# Patient Record
Sex: Female | Born: 1990 | Race: White | Hispanic: No | Marital: Single | Smoking: Never smoker
Health system: Southern US, Community
[De-identification: ages and names within clinical notes are randomized; demographics above are authoritative.]

## PROBLEM LIST (undated history)

## (undated) DIAGNOSIS — Z973 Presence of spectacles and contact lenses: Secondary | ICD-10-CM

## (undated) DIAGNOSIS — M542 Cervicalgia: Secondary | ICD-10-CM

## (undated) DIAGNOSIS — M25519 Pain in unspecified shoulder: Secondary | ICD-10-CM

## (undated) DIAGNOSIS — Z8619 Personal history of other infectious and parasitic diseases: Secondary | ICD-10-CM

## (undated) DIAGNOSIS — R51 Headache: Secondary | ICD-10-CM

## (undated) DIAGNOSIS — R519 Headache, unspecified: Secondary | ICD-10-CM

## (undated) HISTORY — DX: Personal history of other infectious and parasitic diseases: Z86.19

## (undated) HISTORY — PX: NO PAST SURGERIES: SHX2092

## (undated) HISTORY — DX: Headache: R51

## (undated) HISTORY — DX: Headache, unspecified: R51.9

---
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
## 2012-01-18 LAB — HM PAP SMEAR: HM PAP: NORMAL

## 2013-08-05 ENCOUNTER — Encounter: Payer: Self-pay | Admitting: Adult Health

## 2013-08-05 ENCOUNTER — Ambulatory Visit (INDEPENDENT_AMBULATORY_CARE_PROVIDER_SITE_OTHER): Payer: 59 | Admitting: Adult Health

## 2013-08-05 VITALS — BP 106/70 | HR 71 | Temp 98.4°F | Resp 14 | Ht 65.5 in | Wt 240.0 lb

## 2013-08-05 DIAGNOSIS — B49 Unspecified mycosis: Secondary | ICD-10-CM | POA: Insufficient documentation

## 2013-08-05 DIAGNOSIS — E669 Obesity, unspecified: Secondary | ICD-10-CM

## 2013-08-05 DIAGNOSIS — Z0000 Encounter for general adult medical examination without abnormal findings: Secondary | ICD-10-CM

## 2013-08-05 DIAGNOSIS — E7800 Pure hypercholesterolemia, unspecified: Secondary | ICD-10-CM

## 2013-08-05 NOTE — Progress Notes (Signed)
Patient ID: Kimberly Newman, female   DOB: 03/31/1991, 23 y.o.   MRN: [id number]   Subjective:    Patient ID: Kimberly Newman, female    DOB: [birth year], 23 y.o.   MRN: [id number]  HPI  Pt is a 23 y/o female who presents to clinic to establish care. She was previously followed at [facility]. She is feeling well overall. She would like her ears checked. Her mother has recently been diagnosed and treated for ear fungus. They have been sharing earphones. She also reports problems with her tonsils at least twice a year. None at present.  Kasen brings last labs drawn at [employer] for employee health screenings. It shows borderline elevated HgbA1c, BMI 38.5, total cholesterol 253, HDL 48, LDL 187, triglycerides 95, waist circumference 45.25. She has been going to the gym approximately 4 times a week. She reports she could improve on her diet and is trying to do so.    Past Medical History  Diagnosis Date  . History of chickenpox   . Frequent headaches     Ibuprofen   Surgery Hx: Non contributory   Family History  Problem Relation Age of Onset  . Hyperlipidemia Mother   . Hypertension Mother   . Kidney disease Mother     Kidney stones  . Diabetes Mother   . Alcohol abuse Maternal Grandmother   . Hyperlipidemia Maternal Grandmother   . Hypertension Maternal Grandmother   . Diabetes Maternal Grandmother   . Leukemia Maternal Grandmother   . Hyperlipidemia Maternal Grandfather   . Hypertension Maternal Grandfather   . Diabetes Maternal Grandfather   . Parkinson's disease Maternal Grandfather      History   Social History  . Marital Status: Single    Spouse Name: N/A    Number of Children: N/A  . Years of Education: N/A   Occupational History  . Not on file.   Social History Main Topics  . Smoking status: Never Smoker   . Smokeless tobacco: Not on file  . Alcohol Use: No  . Drug Use: No  . Sexual Activity: Not on file   Other Topics Concern  . Not on file    Social History Narrative  . No narrative on file     Review of Systems  Constitutional: Negative.   HENT: Negative.   Eyes: Negative.   Respiratory: Negative.   Cardiovascular: Negative.   Gastrointestinal: Negative.   Endocrine: Negative.   Genitourinary: Negative.   Musculoskeletal: Negative.   Skin: Negative.   Allergic/Immunologic: Negative.   Neurological: Negative.   Hematological: Negative.   Psychiatric/Behavioral: Negative.        Objective:  There were no vitals taken for this visit.   Physical Exam  Constitutional: She is oriented to person, place, and time. No distress.  Overweight, pleasant 23 y/o  HENT:  Head: Normocephalic and atraumatic.  Nose: Nose normal.  Mouth/Throat: Oropharynx is clear and moist.  Bilateral ear fungus  Eyes: Conjunctivae and EOM are normal. Pupils are equal, round, and reactive to light.  Neck: Normal range of motion. Neck supple. No tracheal deviation present. No thyromegaly present.  Cardiovascular: Normal rate, regular rhythm, normal heart sounds and intact distal pulses.  Exam reveals no gallop and no friction rub.   No murmur heard. Pulmonary/Chest: Effort normal and breath sounds normal. No respiratory distress. She has no wheezes. She has no rales.  Abdominal: Soft.  Musculoskeletal: Normal range of motion. She exhibits no edema and no tenderness.  Lymphadenopathy:  She has no cervical adenopathy.  Neurological: She is alert and oriented to person, place, and time. She has normal reflexes. No cranial nerve deficit. Coordination normal.  Skin: Skin is warm and dry.  Psychiatric: She has a normal mood and affect. Her behavior is normal. Judgment and thought content normal.       Assessment & Plan:   1. Routine general medical examination at a health care facility Normal physical exam. Labs ordered cbc, cmet, lipids, tsh. She will have these done at [facility]. Up to date on PAP. Done last year and reports normal.  2.  Elevated cholesterol Check lipids. Encourage improvement in diet. Continue exercise  3. Obesity (BMI 30-39.9) Employer requires decrease of 10% of body weight for optimal insurance premium rate. She has been going to the gym but needs to improve in diet.  4. Fungus infection Mild fungal infection. Suspect from sharing earphone with her mother who was diagnosed and treated for same. Lotrimin cream bilateral ears at bedtime x 2 weeks.

## 2013-08-05 NOTE — Progress Notes (Signed)
Pre visit review using our clinic review tool, if applicable. No additional management support is needed unless otherwise documented below in the visit note. 

## 2013-08-15 ENCOUNTER — Telehealth: Payer: Self-pay | Admitting: *Deleted

## 2013-08-15 NOTE — Telephone Encounter (Signed)
Left vm requesting pt to return my call, need to give her lab results

## 2013-08-16 NOTE — Telephone Encounter (Signed)
Pt left vm.  Returning call.

## 2013-08-19 NOTE — Telephone Encounter (Signed)
Notified pt of lab results per R. Rey, "Elevated Cholesterol - Recommend increasing activity, aerobic exercise, low fat, low cholesterol diet, will recheck in 3-6 mths."

## 2013-08-25 ENCOUNTER — Encounter: Payer: Self-pay | Admitting: Adult Health

## 2013-09-17 ENCOUNTER — Encounter: Payer: Self-pay | Admitting: Adult Health

## 2013-09-30 ENCOUNTER — Encounter: Payer: Self-pay | Admitting: Internal Medicine

## 2013-09-30 ENCOUNTER — Ambulatory Visit (INDEPENDENT_AMBULATORY_CARE_PROVIDER_SITE_OTHER): Payer: 59 | Admitting: Internal Medicine

## 2013-09-30 VITALS — BP 110/72 | HR 79 | Temp 99.1°F | Resp 16 | Ht 65.5 in | Wt 239.0 lb

## 2013-09-30 DIAGNOSIS — S93409A Sprain of unspecified ligament of unspecified ankle, initial encounter: Secondary | ICD-10-CM

## 2013-09-30 DIAGNOSIS — M25579 Pain in unspecified ankle and joints of unspecified foot: Secondary | ICD-10-CM

## 2013-09-30 MED ORDER — MELOXICAM 15 MG PO TABS: 15.0000 mg | ORAL_TABLET | Freq: Every day | ORAL | Status: DC

## 2013-09-30 MED ORDER — HYDROCODONE-ACETAMINOPHEN 5-325 MG PO TABS: 1.0000 | ORAL_TABLET | Freq: Four times a day (QID) | ORAL | Status: DC | PRN

## 2013-09-30 NOTE — Progress Notes (Signed)
Pre-visit discussion using our clinic review tool. No additional management support is needed unless otherwise documented below in the visit note.  

## 2013-09-30 NOTE — Progress Notes (Signed)
Patient ID: Kimberly Newman, female   DOB: 03/19/1991, 23 y.o.   MRN: [id number]   Patient Active Problem List   Diagnosis Date Noted  . Sprain of ankle, unspecified site 09/30/2013  . Elevated cholesterol 08/05/2013  . Obesity (BMI 30-39.9) 08/05/2013  . Routine general medical examination at a health care facility 08/05/2013  . Fungus infection 08/05/2013    Subjective:  CC:   Chief Complaint  Patient presents with  . Acute Visit    Twisted ankle fell off rock while [place]. Abrasion on left hand.    HPI:   Kimberly Caprimber Rabanal is a 23 y.o. female who presents for Left lateral ankle pain since Saturday.  Patient had an uncontrolled slide  down a mossy rock while recreational hiking in [place] and twisted her ankle underneath her body .  She had pain , swelling and bruising of the skin over and under her lateral malleolus.  Has been applying ice and taking ibuprofen but has been walking on it.  [place] with inversion and eversion (more with inversion) and dorsiflexion.     Past Medical History  Diagnosis Date  . History of chickenpox   . Frequent headaches     Ibuprofen    No past surgical history on file.     The following portions of the patient's history were reviewed and updated as appropriate: Allergies, current medications, and problem list.    Review of Systems:   Patient denies headache, fevers, malaise, unintentional weight loss, skin rash, eye pain, sinus congestion and sinus pain, sore throat, dysphagia,  hemoptysis , cough, dyspnea, wheezing, chest pain, palpitations, orthopnea, edema, abdominal pain, nausea, melena, diarrhea, constipation, flank pain, dysuria, hematuria, urinary  Frequency, nocturia, numbness, tingling, seizures,  Focal weakness, Loss of consciousness,  Tremor, insomnia, depression, anxiety, and suicidal ideation.     History   Social History  . Marital Status: Single    Spouse Name: N/A    Number of Children: 0  . Years of  Education: 14   Occupational History  . 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 Smoking status: Never Smoker   . Smokeless tobacco: Not on file  . Alcohol Use: No  . Drug Use: No  . Sexual Activity: Not on file   Other Topics Concern  . Not on file   Social History Narrative   [place] She lives at home with her mother. 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 She has 2 dogs, 5 cats and 1 bird. She enjoys going to the movies, shopping and hiking. She also enjoys working out.      Exercise - 4 days a week   Diet - Trying to work on improvements.    Objective:  Filed Vitals:   09/30/13 1612  BP: 110/72  Pulse: 79  Temp: 99.1 F (37.3 C)  Resp: 16     General appearance: alert, cooperative and appears stated age  Back: symmetric, no curvature. ROM normal. No CVA tenderness. Lungs: clear to auscultation bilaterally Heart: regular rate and rhythm, S1, S2 normal, no murmur, click, rub or gallop Pulses: 2+ and symmetric Skin: Skin color, texture, turgor normal. No rashes or lesions Msk: left lateral malleolus swollen, tender to palpation, pain with passive inversion and dorsiflexion    Assessment and Plan:  Sprain of ankle, unspecified site Plain films to rule out ankle fracture..  Treat for liagment sprain with air cast. And nonweight bearing  for one week.  Crutches,  Vicodin, ice and NSAIDs. Return in one week for repeat evaluation    Updated Medication List Outpatient Encounter Prescriptions as of 09/30/2013  Medication Sig  . HYDROcodone-acetaminophen (NORCO/VICODIN) 5-325 MG per tablet Take 1 tablet by mouth every 6 (six) hours as needed for moderate pain.  . meloxicam (MOBIC) 15 MG tablet Take 1 tablet (15 mg total) by mouth daily.     Orders Placed This Encounter  Procedures  . DME Crutches  . DG Ankle Complete Left    Return in about 1 week (around 10/07/2013).

## 2013-09-30 NOTE — Patient Instructions (Signed)
Meloxicam 15 mg daily (anti inflammatory) vicodin every 6 hours as needed for pain  Use crutches   And Use the aircast  24/7 for the first week  Return in one week   Ankle Sprain An ankle sprain is an injury to the strong, fibrous tissues (ligaments) that hold the bones of your ankle joint together.  CAUSES An ankle sprain is usually caused by a fall or by twisting your ankle. Ankle sprains most commonly occur when you step on the outer edge of your foot, and your ankle turns inward. People who participate in sports are more prone to these types of injuries.  SYMPTOMS   Pain in your ankle. The pain may be present at rest or only when you are trying to stand or walk.  Swelling.  Bruising. Bruising may develop immediately or within 1 to 2 days after your injury.  Difficulty standing or walking, particularly when turning corners or changing directions. DIAGNOSIS  Your caregiver will ask you details about your injury and perform a physical exam of your ankle to determine if you have an ankle sprain. During the physical exam, your caregiver will press on and apply pressure to specific areas of your foot and ankle. Your caregiver will try to move your ankle in certain ways. An X-ray exam may be done to be sure a bone was not broken or a ligament did not separate from one of the bones in your ankle (avulsion fracture).  TREATMENT  Certain types of braces can help stabilize your ankle. Your caregiver can make a recommendation for this. Your caregiver may recommend the use of medicine for pain. If your sprain is severe, your caregiver may refer you to a surgeon who helps to restore function to parts of your skeletal system (orthopedist) or a physical therapist. HOME CARE INSTRUCTIONS   Apply ice to your injury for 1 2 days or as directed by your caregiver. Applying ice helps to reduce inflammation and pain.  Put ice in a plastic bag.  Place a towel between your skin and the bag.  Leave the ice  on for 15-20 minutes at a time, every 2 hours while you are awake.  Only take over-the-counter or prescription medicines for pain, discomfort, or fever as directed by your caregiver.  Elevate your injured ankle above the level of your heart as much as possible for 2 3 days.  If your caregiver recommends crutches, use them as instructed. Gradually put weight on the affected ankle. Continue to use crutches or a cane until you can walk without feeling pain in your ankle.  If you have a plaster splint, wear the splint as directed by your caregiver. Do not rest it on anything harder than a pillow for the first 24 hours. Do not put weight on it. Do not get it wet. You may take it off to take a shower or bath.  You may have been given an elastic bandage to wear around your ankle to provide support. If the elastic bandage is too tight (you have numbness or tingling in your foot or your foot becomes cold and blue), adjust the bandage to make it comfortable.  If you have an air splint, you may blow more air into it or let air out to make it more comfortable. You may take your splint off at night and before taking a shower or bath. Wiggle your toes in the splint several times per day to decrease swelling. SEEK MEDICAL CARE IF:   You have  rapidly increasing bruising or swelling.  Your toes feel extremely cold or you lose feeling in your foot.  Your pain is not relieved with medicine. SEEK IMMEDIATE MEDICAL CARE IF:  Your toes are numb or blue.  You have severe pain that is increasing. MAKE SURE YOU:   Understand these instructions.  Will watch your condition.  Will get help right away if you are not doing well or get worse. Document Released: 04/18/2005 Document Revised: 01/11/2012 Document Reviewed: 04/30/2011 [facility] Patient Information 2014 [place], [place].

## 2013-10-01 ENCOUNTER — Ambulatory Visit (INDEPENDENT_AMBULATORY_CARE_PROVIDER_SITE_OTHER)
Admission: RE | Admit: 2013-10-01 | Discharge: 2013-10-01 | Disposition: A | Discharge status: Home / Self Care | Payer: 59 | Source: Ambulatory Visit | Attending: Internal Medicine | Admitting: Internal Medicine

## 2013-10-01 ENCOUNTER — Encounter: Payer: Self-pay | Admitting: Internal Medicine

## 2013-10-01 DIAGNOSIS — M25579 Pain in unspecified ankle and joints of unspecified foot: Secondary | ICD-10-CM

## 2013-10-01 NOTE — Assessment & Plan Note (Signed)
Plain films to rule out ankle fracture..  Treat for liagment sprain with air cast. And nonweight bearing for one week.  Crutches,  Vicodin, ice and NSAIDs. Return in one week for repeat evaluation

## 2013-10-07 ENCOUNTER — Ambulatory Visit (INDEPENDENT_AMBULATORY_CARE_PROVIDER_SITE_OTHER): Payer: 59 | Admitting: Adult Health

## 2013-10-07 ENCOUNTER — Encounter: Payer: Self-pay | Admitting: Adult Health

## 2013-10-07 VITALS — BP 110/72 | HR 83 | Temp 97.6°F | Resp 12 | Ht 65.0 in | Wt 238.5 lb

## 2013-10-07 DIAGNOSIS — Z23 Encounter for immunization: Secondary | ICD-10-CM

## 2013-10-07 DIAGNOSIS — S93409A Sprain of unspecified ligament of unspecified ankle, initial encounter: Secondary | ICD-10-CM

## 2013-10-07 NOTE — Progress Notes (Signed)
Pre visit review using our clinic review tool, if applicable. No additional management support is needed unless otherwise documented below in the visit note. 

## 2013-10-07 NOTE — Progress Notes (Signed)
   Subjective:    Patient ID: Kimberly Newman, female    DOB: 1990/05/19, 23 y.o.   MRN: [id number]  HPI Pleasant 23 y/o female who presents for f/u left sprained ankle. She has a soft cast (splint) in place. She reports not using the crutches 2/2 inability to get around at work. She reports pain is improved. Has not been needing much pain medication. She has some swelling of the ankle. Has been icing and elevating whenever possible. Feels her symptoms are improving.  Pt needs her Tdap vaccine today.  Past Medical History  Diagnosis Date  . History of chickenpox   . Frequent headaches     Ibuprofen   Current Outpatient Prescriptions on File Prior to Visit  Medication Sig Dispense Refill  . HYDROcodone-acetaminophen (NORCO/VICODIN) 5-325 MG per tablet Take 1 tablet by mouth every 6 (six) hours as needed for moderate pain.  60 tablet  0  . meloxicam (MOBIC) 15 MG tablet Take 1 tablet (15 mg total) by mouth daily.  30 tablet  0   No current facility-administered medications on file prior to visit.    Review of Systems  Musculoskeletal: Positive for arthralgias and joint swelling.       Left ankle swelling.   Neurological: Negative for numbness.  All other systems reviewed and are negative.      Objective:   Physical Exam  Constitutional: She is oriented to person, place, and time. No distress.  Overweight, pleasant 23 y/o female  HENT:  Head: Normocephalic and atraumatic.  Eyes: Conjunctivae and EOM are normal.  Neck: Normal range of motion. Neck supple.  Cardiovascular: Normal rate and regular rhythm.   Pulmonary/Chest: Effort normal. No respiratory distress.  Musculoskeletal: Normal range of motion.  Neurological: She is alert and oriented to person, place, and time. She has normal reflexes.  Skin: Skin is warm and dry.  Psychiatric: She has a normal mood and affect. Her behavior is normal. Judgment and thought content normal.   BP 110/72  Pulse 83  Temp(Src) 97.6 F  (36.4 C) (Oral)  Resp 12  Ht 5\' 5"  (1.651 m)  Wt 238 lb 8 oz (108.183 kg)  BMI 39.69 kg/m2  SpO2 96%  LMP 09/11/2013     Assessment & Plan:   1. Need for prophylactic vaccination with combined diphtheria-tetanus-pertussis (DTP) vaccine Received in clinic today - Tdap vaccine greater than or equal to 7yo IM  2. Sprain of ankle, unspecified site Improving. Continues to elevate and ice. Not requiring too much pain medication. She will call if symptoms worsen. Continue to wear splint

## 2014-04-01 ENCOUNTER — Ambulatory Visit (INDEPENDENT_AMBULATORY_CARE_PROVIDER_SITE_OTHER): Payer: 59 | Admitting: Nurse Practitioner

## 2014-04-01 ENCOUNTER — Encounter: Payer: Self-pay | Admitting: Nurse Practitioner

## 2014-04-01 VITALS — BP 108/76 | HR 75 | Temp 98.1°F | Resp 14 | Ht 65.0 in | Wt 237.2 lb

## 2014-04-01 DIAGNOSIS — L03319 Cellulitis of trunk, unspecified: Secondary | ICD-10-CM

## 2014-04-01 DIAGNOSIS — L02219 Cutaneous abscess of trunk, unspecified: Secondary | ICD-10-CM

## 2014-04-01 NOTE — Progress Notes (Signed)
Subjective:    Patient ID: Kimberly Newman, female    DOB: 03/07/1991, 23 y.o.   MRN: [id number]  HPI  [name] is a 23 yo female here for a follow up of abdominal abcess/cellulitis after being seen at [facility] urgent care on last Friday 11/27. Accompanied by mother today who is a Psychologist, counsellingmicrobiologist at [employer].  This began on the Sunday previous (11/22)  noticed a small tender red spot on left abdomen. Worsened by that Wednesday 11/25 with fever, oozing from site. Went to [facility], it was incised and drained, packed, and doxycycline 100 mg BID (unknown period of time per pt). She states she returned on that Saturday for removal and re-packing. She had the last of the packing removed on Monday 11/30 and was told to FU with PCP. She states it has improved and she feels better.  Today: Still draining serous/purulent fluid, size is 2 cm in length and 0.5 cm open wound in height. No fever today. It is covered with large gauze and taped to abdomen. She is to leave it open to air. There is still bacitracin ointment in and around wound. The site is still erythematous and hard around the site. She is still taking Doxycycline without issue and stated that the urgent care personnel felt there were tracks and possibly more abscess left. They drained the largest pocket - per pt.   Review of Systems  Positive for erythema, open wound, pruritus, and purulent drainage.  Denies fevers, chills, sweats, increased drainage, increase in size of site.  Past Medical History  Diagnosis Date  . History of chickenpox   . Frequent headaches     Ibuprofen    History   Social History  . Marital Status: Single    Spouse Name: N/A    Number of Children: 0  . Years of Education: 14   Occupational History  . 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 Smoking status: Never Smoker   . Smokeless tobacco: Not on file  . Alcohol Use: No  . Drug Use: No  . Sexual Activity: Not on  file   Other Topics Concern  . 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 She lives at home with her mother. 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 She has 2 dogs, 5 cats and 1 bird. She enjoys going to the movies, shopping and hiking. She also enjoys working out.      Exercise - 4 days a week   Diet - Trying to work on improvements.    No past surgical history on file.  Family History  Problem Relation Age of Onset  . Hyperlipidemia Mother   . Hypertension Mother   . Kidney disease Mother     Kidney stones  . Diabetes Mother   . Alcohol abuse Maternal Grandmother   . Hyperlipidemia Maternal Grandmother   . Hypertension Maternal Grandmother   . Diabetes Maternal Grandmother   . Leukemia Maternal Grandmother   . Hyperlipidemia Maternal Grandfather   . Hypertension Maternal Grandfather   . Diabetes Maternal Grandfather   . Parkinson's disease Maternal Grandfather     No Known Allergies  No current outpatient prescriptions on file prior to visit.   No current facility-administered medications on file prior to visit.      Objective:   Physical Exam  Constitutional: She is oriented to person, place, and time.  Abdominal: Soft.  Bowel sounds are normal. There is tenderness in the left lower quadrant.    Neurological: She is alert and oriented to person, place, and time.  Skin: Laceration noted. No abrasion, no bruising, no burn, no ecchymosis, no lesion, no petechiae and no rash noted. She is not diaphoretic. There is erythema.     Psychiatric: She has a normal mood and affect. Her behavior is normal. Judgment and thought content normal.     BP 108/76 mmHg  Pulse 75  Temp(Src) 98.1 F (36.7 C) (Oral)  Resp 14  Ht 5\' 5"  (1.651 m)  Wt 237 lb 4 oz (107.616 kg)  BMI 39.48 kg/m2  SpO2 96%     Assessment & Plan:

## 2014-04-01 NOTE — Progress Notes (Signed)
Pre visit review using our clinic review tool, if applicable. No additional management support is needed unless otherwise documented below in the visit note. 

## 2014-04-01 NOTE — Patient Instructions (Signed)
Referral has been sent to a general surgeon for evaluation and treatment. Our office will call you with more information.  If there is a return of fever, increased itching, change in drainage please call our office.  It is okay to leave open to air. Use ointment sparingly.   Delayed Wound Closure Sometimes, your health care provider will decide to delay closing a wound for several days. This is done when the wound is badly bruised, dirty, or when it has been several hours since the injury happened. By delaying the closure of your wound, the risk of infection is reduced. Wounds that are closed in 3-7 days after being cleaned up and dressed heal just as well as those that are closed right away. HOME CARE INSTRUCTIONS  Rest and elevate the injured area until the pain and swelling are gone.  Have your wound checked as instructed by your health care provider. SEEK MEDICAL CARE IF:  You develop unusual or increased swelling or redness around the wound.  You have increasing pain or tenderness.  There is increasing fluid (drainage) or a bad smelling drainage coming from the wound. Document Released: 04/18/2005 Document Revised: 04/23/2013 Document Reviewed: 10/16/2012 [facility] Patient Information 2015 [place], [place]. This information is not intended to replace advice given to you by your health care provider. Make sure you discuss any questions you have with your health care provider.

## 2014-04-02 DIAGNOSIS — L03319 Cellulitis of trunk, unspecified: Principal | ICD-10-CM

## 2014-04-02 DIAGNOSIS — L02219 Cutaneous abscess of trunk, unspecified: Secondary | ICD-10-CM | POA: Insufficient documentation

## 2014-04-02 NOTE — Assessment & Plan Note (Addendum)
Unresolved- Seen at [facility] 3 x for I&D, packing and removal of packing. I still feel that it will not resolve if there is left over infection. Due to location I placed a urgent referral to general surgery for evaluation and further treatment. Size at visit 2 cm long by 0.5 cm height - did not check depth. Still draining. Discussed leaving open to air, cleaning area with saline (at drugstores), covering while showering, and drying thoroughly after shower. FU after surgical referral is complete. Requested culture information from [facility].

## 2014-04-07 ENCOUNTER — Ambulatory Visit (INDEPENDENT_AMBULATORY_CARE_PROVIDER_SITE_OTHER): Payer: 59 | Admitting: General Surgery

## 2014-04-07 ENCOUNTER — Encounter: Payer: Self-pay | Admitting: General Surgery

## 2014-04-07 VITALS — BP 118/78 | HR 78 | Resp 12 | Ht 65.0 in | Wt 242.0 lb

## 2014-04-07 DIAGNOSIS — IMO0002 Reserved for concepts with insufficient information to code with codable children: Secondary | ICD-10-CM

## 2014-04-07 DIAGNOSIS — K651 Peritoneal abscess: Secondary | ICD-10-CM

## 2014-04-07 NOTE — Patient Instructions (Signed)
Patient to return as needed. 

## 2014-04-07 NOTE — Progress Notes (Signed)
Patient ID: Kimberly Newman, female   DOB: 01/26/1991, 23 y.o.   MRN: [id number]  Chief Complaint  Patient presents with  . Other    abdomen abscess    HPI Kimberly Newman is a 23 y.o. female here today for a evaluation of an abdomen abscess. Patient noticed this about three weeks ago.  Patient states [facility]  [facility]  And they drained the area . Patient is currently taking doxycyline two times daily.  Patient states the area is still red and draining only a little.  No fever since the 03/28/14. The patient's mother who attended her today reports that non-MRSA staph was reported. HPI  Past Medical History  Diagnosis Date  . History of chickenpox   . Frequent headaches     Ibuprofen    No past surgical history on file.  Family History  Problem Relation Age of Onset  . Hyperlipidemia Mother   . Hypertension Mother   . Kidney disease Mother     Kidney stones  . Diabetes Mother   . Alcohol abuse Maternal Grandmother   . Hyperlipidemia Maternal Grandmother   . Hypertension Maternal Grandmother   . Diabetes Maternal Grandmother   . Leukemia Maternal Grandmother   . Hyperlipidemia Maternal Grandfather   . Hypertension Maternal Grandfather   . Diabetes Maternal Grandfather   . Parkinson's disease Maternal Grandfather     Social History History  Substance Use Topics  . Smoking status: Never Smoker   . Smokeless tobacco: Never Used  . Alcohol Use: 0.0 oz/week    0 Not specified per week    No Known Allergies  Current Outpatient Prescriptions  Medication Sig Dispense Refill  . doxycycline (VIBRAMYCIN) 100 MG capsule Take 100 mg by mouth 2 (two) times daily.   0   No current facility-administered medications for this visit.    Review of Systems Review of Systems  Constitutional: Negative.   Respiratory: Negative.   Cardiovascular: Negative.     Blood pressure 118/78, pulse 78, resp. rate 12, height 5\' 5"  (1.651 m), weight 242 lb (109.77 kg),  last menstrual period 03/19/2014.  Physical Exam Physical Exam  Abdominal: Soft. Normal appearance and bowel sounds are normal. There is no tenderness.    Thickening about a inch from the cut . No redness.     Data Reviewed None available.  Assessment    Superficial abdominal wall abscess, markedly improved status post incision and drainage and institution of doxycycline therapy.    Plan    It may take several weeks for the residual thickening to resolve. This will be accelerated if she makes use of local heat to the area. As long she shows continued improvement no additional intervention is required. Patient to return as needed.     PCP:  Margaretmary Eddyoss, Carrie   Mattison Stuckey W 04/08/2014, 11:53 AM

## 2014-04-08 DIAGNOSIS — IMO0002 Reserved for concepts with insufficient information to code with codable children: Secondary | ICD-10-CM | POA: Insufficient documentation

## 2014-05-12 ENCOUNTER — Encounter: Payer: Self-pay | Admitting: Nurse Practitioner

## 2014-05-12 ENCOUNTER — Ambulatory Visit (INDEPENDENT_AMBULATORY_CARE_PROVIDER_SITE_OTHER): Payer: Commercial Managed Care - PPO | Admitting: Nurse Practitioner

## 2014-05-12 VITALS — BP 118/72 | HR 87 | Temp 98.1°F | Resp 12 | Ht 65.0 in | Wt 244.0 lb

## 2014-05-12 DIAGNOSIS — L03319 Cellulitis of trunk, unspecified: Secondary | ICD-10-CM

## 2014-05-12 DIAGNOSIS — L02219 Cutaneous abscess of trunk, unspecified: Secondary | ICD-10-CM

## 2014-05-12 NOTE — Progress Notes (Signed)
Pre visit review using our clinic review tool, if applicable. No additional management support is needed unless otherwise documented below in the visit note. 

## 2014-05-12 NOTE — Patient Instructions (Signed)
Continue on Doxycycline. Please take a probiotic ( Align, Floraque or Culturelle) while you are on the antibiotic to prevent a serious antibiotic associated diarrhea  Called clostirudium dificile colitis and a vaginal yeast infection.   We will follow up next week. At that time we can do STD testing.

## 2014-05-12 NOTE — Progress Notes (Signed)
Subjective:    Patient ID: Kimberly Newman, female    DOB: [birth year], 24 y.o.   MRN: [id number]  HPI  Kimberly Newman is a 24 yo female with a CC of draining abscess in a new location on her left lower abdomen.   1) Pt reports [facility] and [facility]/8  Draining and provider checked it, no I&D recommended  Doxycycline helping 10 days on day 5, taking twice daily   Changes dressing 3 x day   Pt states it is improving.   2) Question about STD testing. Sexually active with 1 female partner. She wanted to know about what kind of tests are done and how they are sampled (blood, urine, swab ect...). She would like to return for STD testing.   Review of Systems  Constitutional: Negative for fever, chills, diaphoresis and fatigue.  Respiratory: Negative for chest tightness, shortness of breath and wheezing.   Cardiovascular: Negative for chest pain, palpitations and leg swelling.  Gastrointestinal: Negative for nausea, vomiting, diarrhea and rectal pain.  Skin: Positive for wound. Negative for rash.       New draining area on abdomen.   Neurological: Negative for dizziness, weakness, numbness and headaches.  Psychiatric/Behavioral: The patient is not nervous/anxious.    Past Medical History  Diagnosis Date  . History of chickenpox   . Frequent headaches     Ibuprofen    History   Social History  . Marital Status: Single    Spouse Name: N/A    Number of Children: 0  . Years of Education: 14   Occupational History  . Personnel officer [employer]   Social History Main Topics  . Smoking status: Never Smoker   . Smokeless tobacco: Never Used  . Alcohol Use: 0.0 oz/week    0 Not specified per week  . Drug Use: No  . Sexual Activity: Not on file   Other Topics Concern  . 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 She lives at home with her mother. 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 She has 2 dogs, 5 cats and 1  bird. She enjoys going to the movies, shopping and hiking. She also enjoys working out.      Exercise - 4 days a week   Diet - Trying to work on improvements.    No past surgical history on file.  Family History  Problem Relation Age of Onset  . Hyperlipidemia Mother   . Hypertension Mother   . Kidney disease Mother     Kidney stones  . Diabetes Mother   . Alcohol abuse Maternal Grandmother   . Hyperlipidemia Maternal Grandmother   . Hypertension Maternal Grandmother   . Diabetes Maternal Grandmother   . Leukemia Maternal Grandmother   . Hyperlipidemia Maternal Grandfather   . Hypertension Maternal Grandfather   . Diabetes Maternal Grandfather   . Parkinson's disease Maternal Grandfather     No Known Allergies  Current Outpatient Prescriptions on File Prior to Visit  Medication Sig Dispense Refill  . doxycycline (VIBRAMYCIN) 100 MG capsule Take 100 mg by mouth 2 (two) times daily.   0   No current facility-administered medications on file prior to visit.       Objective:   Physical Exam  Constitutional: She is oriented to person, place, and time. She appears well-developed and well-nourished. No distress.  Cardiovascular: Normal rate and regular rhythm.   Pulmonary/Chest:  Effort normal and breath sounds normal.  Abdominal: She exhibits no distension and no mass. There is tenderness. There is no rebound and no guarding.  LLQ- abscess  Neurological: She is alert and oriented to person, place, and time.  Skin: Skin is warm. She is not diaphoretic.     Psychiatric: She has a normal mood and affect. Her behavior is normal. Judgment and thought content normal.      Assessment & Plan:

## 2014-05-13 NOTE — Assessment & Plan Note (Signed)
New area. Improved old area. Continue Doxycyline until finished prescription. FU in 1 week. Warm compresses encouraged to continue draining. Add OTC probiotic to regimen.

## 2014-05-20 ENCOUNTER — Ambulatory Visit (INDEPENDENT_AMBULATORY_CARE_PROVIDER_SITE_OTHER): Payer: Commercial Managed Care - PPO | Admitting: Nurse Practitioner

## 2014-05-20 ENCOUNTER — Encounter: Payer: Self-pay | Admitting: Nurse Practitioner

## 2014-05-20 VITALS — BP 112/70 | HR 87 | Temp 97.9°F | Resp 12 | Ht 65.0 in | Wt 244.8 lb

## 2014-05-20 DIAGNOSIS — L02219 Cutaneous abscess of trunk, unspecified: Secondary | ICD-10-CM

## 2014-05-20 DIAGNOSIS — L03319 Cellulitis of trunk, unspecified: Secondary | ICD-10-CM

## 2014-05-20 DIAGNOSIS — Z113 Encounter for screening for infections with a predominantly sexual mode of transmission: Secondary | ICD-10-CM

## 2014-05-20 NOTE — Assessment & Plan Note (Signed)
Improving. Not draining currently, redness improved. 4 mm scab at center. Continue warm wet compresses

## 2014-05-20 NOTE — Patient Instructions (Signed)
Please visit the lab before leaving today.   Warm wet compresses on abdomen.

## 2014-05-20 NOTE — Assessment & Plan Note (Signed)
Stable. New partner. Not been previously tested. STD panel today and urine GC/Chlamydia.

## 2014-05-20 NOTE — Progress Notes (Signed)
Subjective:    Patient ID: Kimberly Newman, female    DOB: 09/19/1990, 24 y.o.   MRN: [id number]  HPI  Kimberly Newman is a 24 yo female with a CC follow up for abscess and wanting to be tested for STDs.   1) Finished antibiotics 2-3 days ago without complaints of diarrhea.  4 mm center, looks improved, redness around area is gone,   2) LMP- 05/10/14 - 05/15/14, normal   Sexually- female 7 months monogamous    No female partners before for current partner   Previous female partners for pt - 1.5 years prior   Review of Systems  Constitutional: Negative for fever, chills, diaphoresis and fatigue.  Respiratory: Negative for chest tightness, shortness of breath and wheezing.   Cardiovascular: Negative for chest pain, palpitations and leg swelling.  Gastrointestinal: Negative for nausea, vomiting, diarrhea and rectal pain.  Genitourinary: Negative for dysuria, vaginal bleeding, vaginal discharge, vaginal pain and dyspareunia.  Skin: Positive for color change. Negative for rash.       On abdomen from abscess healing, slightly red  Neurological: Negative for dizziness, weakness, numbness and headaches.  Psychiatric/Behavioral: The patient is not nervous/anxious.    Past Medical History  Diagnosis Date  . History of chickenpox   . Frequent headaches     Ibuprofen    History   Social History  . Marital Status: Single    Spouse Name: N/A    Number of Children: 0  . Years of Education: 14   Occupational History  . 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 Smoking status: Never Smoker   . Smokeless tobacco: Never Used  . Alcohol Use: 0.0 oz/week    0 Not specified per week  . Drug Use: No  . Sexual Activity: Not on file   Other Topics Concern  . 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 She lives at home with her mother. 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 She has 2 dogs, 5 cats and 1 bird. She enjoys going  to the movies, shopping and hiking. She also enjoys working out.      Exercise - 4 days a week   Diet - Trying to work on improvements.    No past surgical history on file.  Family History  Problem Relation Age of Onset  . Hyperlipidemia Mother   . Hypertension Mother   . Kidney disease Mother     Kidney stones  . Diabetes Mother   . Alcohol abuse Maternal Grandmother   . Hyperlipidemia Maternal Grandmother   . Hypertension Maternal Grandmother   . Diabetes Maternal Grandmother   . Leukemia Maternal Grandmother   . Hyperlipidemia Maternal Grandfather   . Hypertension Maternal Grandfather   . Diabetes Maternal Grandfather   . Parkinson's disease Maternal Grandfather     No Known Allergies  No current outpatient prescriptions on file prior to visit.   No current facility-administered medications on file prior to visit.       Objective:   Physical Exam  Constitutional: She is oriented to person, place, and time. She appears well-developed and well-nourished. No distress.  BP 112/70 mmHg  Pulse 87  Temp(Src) 97.9 F (36.6 C) (Oral)  Resp 12  Ht 5\' 5"  (1.651 m)  Wt 244 lb 12.8 oz (111.041 kg)  BMI 40.74 kg/m2  SpO2 96%   HENT:  Head: Normocephalic and atraumatic.  Neck: Normal  range of motion.  Cardiovascular: Normal rate and regular rhythm.   Pulmonary/Chest: Effort normal and breath sounds normal.  Abdominal: Soft. Bowel sounds are normal. She exhibits no distension and no mass. There is no tenderness. There is no rebound and no guarding.  Lymphadenopathy:    She has no cervical adenopathy.  Neurological: She is alert and oriented to person, place, and time.  Skin: Skin is warm and dry. No rash noted. She is not diaphoretic.  4 mm healing scab where drainage once was.   Psychiatric: She has a normal mood and affect. Her behavior is normal. Judgment and thought content normal.      Assessment & Plan:

## 2014-05-20 NOTE — Progress Notes (Signed)
Pre visit review using our clinic review tool, if applicable. No additional management support is needed unless otherwise documented below in the visit note. 

## 2014-05-21 LAB — STD PANEL
HEP B S AG: NEGATIVE
HIV 1&2 Ab, 4th Generation: NONREACTIVE

## 2014-05-21 LAB — GC/CHLAMYDIA PROBE AMP, URINE
CHLAMYDIA, SWAB/URINE, PCR: NEGATIVE
GC Probe Amp, Urine: NEGATIVE

## 2014-12-09 ENCOUNTER — Ambulatory Visit (INDEPENDENT_AMBULATORY_CARE_PROVIDER_SITE_OTHER): Payer: 59 | Admitting: Nurse Practitioner

## 2014-12-09 VITALS — BP 108/88 | HR 83 | Temp 98.8°F | Resp 14 | Ht 65.0 in | Wt 244.0 lb

## 2014-12-09 DIAGNOSIS — M25561 Pain in right knee: Secondary | ICD-10-CM | POA: Diagnosis not present

## 2014-12-09 DIAGNOSIS — R51 Headache: Secondary | ICD-10-CM | POA: Diagnosis not present

## 2014-12-09 DIAGNOSIS — R519 Headache, unspecified: Secondary | ICD-10-CM | POA: Insufficient documentation

## 2014-12-09 MED ORDER — FLUTICASONE PROPIONATE 50 MCG/ACT NA SUSP: 2.0000 | Freq: Every day | NASAL | Status: AC

## 2014-12-09 NOTE — Progress Notes (Signed)
Patient ID: Kimberly Newman, female    DOB: 02/02/1991  Age: 24 y.o. MRN: [id number]  CC: Labs Only   HPI Kimberly Newman presents for CC of frequent headaches and knee pain x 1 year, but worsening over 3 weeks.   1) Knee pain- Right knee bad for 1 year, denies trauma, anterior patella, feels like giving out, popping, cliicking, throbbing pain/dull/stabbing occasionally, fluctuating pain. Gym- elliptical made knee sore the next day. Squeaking sound with flex/ext Worst 8/10, 4/10 at best    Ibuprofen- slightly helpful   Hot wraps alt with ice- helpful   2) Headaches- improving over the last month, pt could not stand or do "anything" for 2 days, pressure, pain behind eyes L>R.   Sinus meds- not helpful   Gluten allergy- friend had headaches and   History Kimberly Newman has a past medical history of History of chickenpox and Frequent headaches.   She has no past surgical history on file.   Her family history includes Alcohol abuse in her maternal grandmother; Diabetes in her maternal grandfather, maternal grandmother, and mother; Hyperlipidemia in her maternal grandfather, maternal grandmother, and mother; Hypertension in her maternal grandfather, maternal grandmother, and mother; Kidney disease in her mother; Leukemia in her maternal grandmother; Parkinson's disease in her maternal grandfather.She reports that she has never smoked. She has never used smokeless tobacco. She reports that she drinks alcohol. She reports that she does not use illicit drugs.  No outpatient prescriptions prior to visit.   No facility-administered medications prior to visit.   ROS Review of Systems  Constitutional: Negative for fever, chills, diaphoresis and fatigue.  HENT: Positive for sinus pressure.   Eyes: Negative for visual disturbance.  Musculoskeletal: Positive for arthralgias. Negative for myalgias.       Right knee pain- patella  Skin: Negative for rash.  Neurological: Positive for headaches. Negative for  dizziness.    Objective:  BP 108/88 mmHg  Pulse 83  Temp(Src) 98.8 F (37.1 C)  Resp 14  Ht  (1.651 m)  Wt 244 lb (110.678 kg)  BMI 40.60 kg/m2  SpO2 97%  Physical Exam  Constitutional: She is oriented to person, place, and time. She appears well-developed and well-nourished. No distress.  HENT:  Head: Normocephalic and atraumatic.  Right Ear: External ear normal.  Left Ear: External ear normal.  Cardiovascular: Normal rate, regular rhythm and normal heart sounds.  Exam reveals no gallop and no friction rub.   No murmur heard. Pulmonary/Chest: Effort normal and breath sounds normal. No respiratory distress. She has no wheezes. She has no rales. She exhibits no tenderness.  Musculoskeletal: Normal range of motion. She exhibits tenderness. She exhibits no edema.  Patella tenderness only on right knee, crepitus present, no instability, strength 5/5 illiopsoas and quadricep. 2+Patellar reflex.   Neurological: She is alert and oriented to person, place, and time. No cranial nerve deficit. She exhibits normal muscle tone. Coordination normal.  Skin: Skin is warm and dry. No rash noted. She is not diaphoretic.  Psychiatric: She has a normal mood and affect. Her behavior is normal. Judgment and thought content normal.    Assessment & Plan:   Kimberly Newman was seen today for labs only.  Diagnoses and all orders for this visit:  Frequent headaches  Right knee pain -     DG Knee Complete 4 Views Right; Future  Other orders -     fluticasone (FLONASE) 50 MCG/ACT nasal spray; Place 2 sprays into both nostrils daily.  I am having  Kimberly Newman start on fluticasone.  Meds ordered this encounter  Medications  . fluticasone (FLONASE) 50 MCG/ACT nasal spray    Sig: Place 2 sprays into both nostrils daily.    Dispense:  16 g    Refill:  6    Order Specific Question:  Supervising Provider    Answer:  Sherlene Shams [[id number]]     Follow-up: Return if symptoms worsen or fail to  improve.

## 2014-12-09 NOTE — Patient Instructions (Addendum)
Rest, ice, compression, and elevation of the knee until x-ray comes back. Ibuprofen or aleve is also helpful.   Please have your celiac panel done and we will contact you with results.

## 2014-12-09 NOTE — Assessment & Plan Note (Addendum)
Will obtain Celiac panel from [facility]- form given to pt. Will follow. Will try flonase since it sounds more sinus pressure related.

## 2014-12-09 NOTE — Assessment & Plan Note (Signed)
Right patellar pain w/ crepitus, pt does feel like it is going to give out when standing intermittently. Will obtain right knee x-ray. RICE until results and NSAIDs.

## 2014-12-11 ENCOUNTER — Encounter: Payer: Self-pay | Admitting: Nurse Practitioner

## 2014-12-12 ENCOUNTER — Encounter: Payer: Self-pay | Admitting: Nurse Practitioner

## 2014-12-17 ENCOUNTER — Ambulatory Visit (INDEPENDENT_AMBULATORY_CARE_PROVIDER_SITE_OTHER)
Admission: RE | Admit: 2014-12-17 | Discharge: 2014-12-17 | Disposition: A | Discharge status: Home / Self Care | Payer: 59 | Source: Ambulatory Visit | Attending: Nurse Practitioner | Admitting: Nurse Practitioner

## 2014-12-17 DIAGNOSIS — M25561 Pain in right knee: Secondary | ICD-10-CM

## 2014-12-18 ENCOUNTER — Encounter: Payer: Self-pay | Admitting: Nurse Practitioner

## 2014-12-19 ENCOUNTER — Telehealth: Payer: Self-pay

## 2014-12-19 ENCOUNTER — Other Ambulatory Visit: Payer: Self-pay | Admitting: Nurse Practitioner

## 2014-12-19 DIAGNOSIS — M25561 Pain in right knee: Secondary | ICD-10-CM

## 2014-12-19 NOTE — Telephone Encounter (Signed)
-----   Message from Carrie M Doss, NP sent at 12/18/2014  4:47 PM EDT ----- Please let pt know that she is negative for celiac disease, but since there is not a test for just "gluten sensitivity" she would just try a gluten free diet.   Thanks,  Carrie 

## 2014-12-19 NOTE — Telephone Encounter (Signed)
LMTCB about lab results 

## 2014-12-22 ENCOUNTER — Telehealth: Payer: Self-pay

## 2014-12-22 NOTE — Telephone Encounter (Signed)
Informed pt of lab results, pt verbalized understanding 

## 2014-12-22 NOTE — Telephone Encounter (Signed)
-----   Message from Carollee Leitz, NP sent at 12/18/2014  4:47 PM EDT ----- Please let pt know that she is negative for celiac disease, but since there is not a test for just "gluten sensitivity" she would just try a gluten free diet.   Thanks,  [employer]

## 2014-12-26 ENCOUNTER — Ambulatory Visit: Payer: Self-pay | Admitting: Nurse Practitioner

## 2015-01-01 ENCOUNTER — Encounter: Payer: Self-pay | Admitting: Nurse Practitioner

## 2015-02-05 ENCOUNTER — Encounter: Payer: Self-pay | Admitting: Nurse Practitioner

## 2015-02-05 ENCOUNTER — Ambulatory Visit: Payer: Self-pay | Admitting: Nurse Practitioner

## 2015-02-05 ENCOUNTER — Ambulatory Visit (INDEPENDENT_AMBULATORY_CARE_PROVIDER_SITE_OTHER): Payer: 59 | Admitting: Nurse Practitioner

## 2015-02-05 VITALS — BP 110/86 | HR 75 | Temp 98.6°F | Resp 14 | Ht 65.0 in | Wt 242.6 lb

## 2015-02-05 DIAGNOSIS — M25511 Pain in right shoulder: Secondary | ICD-10-CM | POA: Diagnosis not present

## 2015-02-05 DIAGNOSIS — F411 Generalized anxiety disorder: Secondary | ICD-10-CM | POA: Diagnosis not present

## 2015-02-05 MED ORDER — ALPRAZOLAM 0.5 MG PO TABS: 0.5000 mg | ORAL_TABLET | Freq: Every evening | ORAL | Status: AC | PRN

## 2015-02-05 MED ORDER — CYCLOBENZAPRINE HCL 10 MG PO TABS: 10.0000 mg | ORAL_TABLET | Freq: Three times a day (TID) | ORAL | Status: DC | PRN

## 2015-02-05 NOTE — Assessment & Plan Note (Signed)
Will try flexeril at night. Believed to be musculoskeletal pain. Will speak with Dr. Adriana Simas about possible injection. Will follow

## 2015-02-05 NOTE — Patient Instructions (Signed)
Don't take flexeril and xanax together.   Try flexeril for shoulder. I will talk with Dr. Adriana Simas.

## 2015-02-05 NOTE — Assessment & Plan Note (Addendum)
Xanax 0.5 mg x 10 tablets given to pt for impending flight at the end of this month. Pt understands risks and benefits of xanax. She is aware not to take with flexeril. Script was given to pt to take to pharmacy

## 2015-02-05 NOTE — Progress Notes (Signed)
Pre visit review using our clinic review tool, if applicable. No additional management support is needed unless otherwise documented below in the visit note. 

## 2015-02-05 NOTE — Progress Notes (Signed)
Patient ID: Kimberly Newman, female    DOB: [birth year]  Age: 24 y.o. MRN: [id number]  CC: Follow-up   HPI Kimberly Newman presents for CC of right shoulder pain and flying anxiety.   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 31st for 1 week  Going with others and she is very nervous to fly. She reports watching Youtube videos about planes and it is bothering her.   LMP- 01/31/2015 lasted 6 days   History Kimberly Newman has a past medical history of History of chickenpox and Frequent headaches.   She has no past surgical history on file.   Her family history includes Alcohol abuse in her maternal grandmother; Diabetes in her maternal grandfather, maternal grandmother, and mother; Hyperlipidemia in her maternal grandfather, maternal grandmother, and mother; Hypertension in her maternal grandfather, maternal grandmother, and mother; Kidney disease in her mother; Leukemia in her maternal grandmother; Parkinson's disease in her maternal grandfather.She reports that she has never smoked. She has never used smokeless tobacco. She reports that she drinks alcohol. She reports that she does not use illicit drugs.  Outpatient Prescriptions Prior to Visit  Medication Sig Dispense Refill  . fluticasone (FLONASE) 50 MCG/ACT nasal spray Place 2 sprays into both nostrils daily. 16 g 6   No facility-administered medications prior to visit.    ROS Review of Systems  Constitutional: Negative for fever, chills, diaphoresis and fatigue.  Respiratory: Negative for chest tightness, shortness of breath and wheezing.   Cardiovascular: Negative for chest pain, palpitations and leg swelling.  Gastrointestinal: Negative for nausea, vomiting and diarrhea.  Musculoskeletal: Positive for myalgias and arthralgias.  Skin: Negative for rash.  Neurological:  Negative for dizziness, weakness, numbness and headaches.  Psychiatric/Behavioral: The patient is nervous/anxious.     Objective:  BP 110/86 mmHg  Pulse 75  Temp(Src) 98.6 F (37 C)  Resp 14  Ht  (1.651 m)  Wt 242 lb 9.6 oz (110.043 kg)  BMI 40.37 kg/m2  SpO2 98%  Physical Exam  Constitutional: She is oriented to person, place, and time. She appears well-developed and well-nourished. No distress.  HENT:  Head: Normocephalic and atraumatic.  Right Ear: External ear normal.  Left Ear: External ear normal.  Cardiovascular: Normal rate, regular rhythm and normal heart sounds.   Pulmonary/Chest: Effort normal and breath sounds normal. No respiratory distress. She has no wheezes. She has no rales. She exhibits no tenderness.  Musculoskeletal: Normal range of motion. She exhibits tenderness. She exhibits no edema.  Tender trapezius muscle and tender supraspinatus. Non-tender AC joint and glenohumeral area  Neurological: She is alert and oriented to person, place, and time. No cranial nerve deficit. She exhibits normal muscle tone. Coordination normal.  Skin: Skin is warm and dry. No rash noted. She is not diaphoretic.  Psychiatric: She has a normal mood and affect. Her behavior is normal. Judgment and thought content normal.   Assessment & Plan:   Roselin was seen today for follow-up.  Diagnoses and all orders for this visit:  Generalized anxiety disorder  Right shoulder pain  Other orders -     cyclobenzaprine (FLEXERIL) 10 MG tablet; Take 1 tablet (10 mg total) by mouth 3 (three) times daily as needed for muscle spasms. -     ALPRAZolam (XANAX) 0.5 MG tablet; Take 1 tablet (0.5 mg total) by  mouth at bedtime as needed for anxiety.  I am having Ms. Rubalcava start on cyclobenzaprine and ALPRAZolam. I am also having her maintain her fluticasone.  Meds ordered this encounter  Medications  . cyclobenzaprine (FLEXERIL) 10 MG tablet    Sig: Take 1 tablet (10 mg total) by mouth 3  (three) times daily as needed for muscle spasms.    Dispense:  30 tablet    Refill:  0    Order Specific Question:  Supervising Provider    Answer:  Duncan Dull L [[id number]]  . ALPRAZolam (XANAX) 0.5 MG tablet    Sig: Take 1 tablet (0.5 mg total) by mouth at bedtime as needed for anxiety.    Dispense:  10 tablet    Refill:  0    Order Specific Question:  Supervising Provider    Answer:  Sherlene Shams [[id number]]     Follow-up: Return if symptoms worsen or fail to improve.

## 2016-01-19 ENCOUNTER — Ambulatory Visit (INDEPENDENT_AMBULATORY_CARE_PROVIDER_SITE_OTHER)
Admission: RE | Admit: 2016-01-19 | Discharge: 2016-01-19 | Disposition: A | Discharge status: Home / Self Care | Payer: 59 | Source: Ambulatory Visit | Attending: Family | Admitting: Family

## 2016-01-19 ENCOUNTER — Encounter: Payer: Self-pay | Admitting: Family

## 2016-01-19 ENCOUNTER — Ambulatory Visit (INDEPENDENT_AMBULATORY_CARE_PROVIDER_SITE_OTHER): Payer: 59 | Admitting: Family

## 2016-01-19 VITALS — BP 108/98 | HR 98 | Temp 99.1°F | Wt 244.6 lb

## 2016-01-19 DIAGNOSIS — R05 Cough: Secondary | ICD-10-CM

## 2016-01-19 DIAGNOSIS — J029 Acute pharyngitis, unspecified: Secondary | ICD-10-CM

## 2016-01-19 DIAGNOSIS — R059 Cough, unspecified: Secondary | ICD-10-CM

## 2016-01-19 LAB — POCT RAPID STREP A (OFFICE): Rapid Strep A Screen: NEGATIVE

## 2016-01-19 MED ORDER — ALBUTEROL SULFATE HFA 108 (90 BASE) MCG/ACT IN AERS
2.0000 | INHALATION_SPRAY | Freq: Four times a day (QID) | RESPIRATORY_TRACT | 1 refills | Status: AC | PRN
Start: 2016-01-19 — End: ?

## 2016-01-19 MED ORDER — AMOXICILLIN 500 MG PO TABS: ORAL_TABLET | ORAL | 0 refills | Status: DC

## 2016-01-19 NOTE — Progress Notes (Signed)
Subjective:    Patient ID: Kimberly Newman, female    DOB: 1990-05-22, 25 y.o.   MRN: [id number]  CC: [name] is a 25 y.o. female who presents today for an acute visit.    HPI: Patient presents today for acute visit with chief complaint sinus congestion for 6 days, unchanged. Accompanied by her mother. Mother works at [employer] in microbiology.She had been seen at Urgent care 3 days ago.  3 days ago had blood in her sputum( since resolved) and mom wanted to culture sputum and throat. Throat culture results from urgent care are not available at this time.  Endorses nausea and vomiting which has resolved; patient believes that it was likely cough syrup. Continues to have tactile warm fevers, chills. Has been on codeine syrup and mucinex with some relief . She was also given lidocaine for sore throat.   Has seasonal allergies. On flonase.   For several years, has had tonsils enlarged. Has never seen ENT.       HISTORY:  Past Medical History:  Diagnosis Date  . Frequent headaches    Ibuprofen  . History of chickenpox    History reviewed. No pertinent surgical history. Family History  Problem Relation Age of Onset  . Hyperlipidemia Mother   . Hypertension Mother   . Kidney disease Mother     Kidney stones  . Diabetes Mother   . Alcohol abuse Maternal Grandmother   . Hyperlipidemia Maternal Grandmother   . Hypertension Maternal Grandmother   . Diabetes Maternal Grandmother   . Leukemia Maternal Grandmother   . Hyperlipidemia Maternal Grandfather   . Hypertension Maternal Grandfather   . Diabetes Maternal Grandfather   . Parkinson's disease Maternal Grandfather     Allergies: Review of patient's allergies indicates no known allergies. Current Outpatient Prescriptions on File Prior to Visit  Medication Sig Dispense Refill  . ALPRAZolam (XANAX) 0.5 MG tablet Take 1 tablet (0.5 mg total) by mouth at bedtime as needed for anxiety. 10 tablet 0  . cyclobenzaprine  (FLEXERIL) 10 MG tablet Take 1 tablet (10 mg total) by mouth 3 (three) times daily as needed for muscle spasms. 30 tablet 0  . fluticasone (FLONASE) 50 MCG/ACT nasal spray Place 2 sprays into both nostrils daily. 16 g 6   No current facility-administered medications on file prior to visit.     Social History  Substance Use Topics  . Smoking status: Never Smoker  . Smokeless tobacco: Never Used  . Alcohol use 0.0 oz/week    Review of Systems  Constitutional: Positive for chills and fever.  HENT: Positive for congestion, ear pain (right), sore throat and voice change. Negative for sinus pressure and trouble swallowing.   Respiratory: Positive for cough, shortness of breath and wheezing.   Cardiovascular: Negative for chest pain and palpitations.  Gastrointestinal: Negative for nausea and vomiting.  Neurological: Positive for headaches (mild, sinus).      Objective:    BP (!) 108/98   Pulse 98   Temp 99.1 F (37.3 C) (Oral)   Wt 244 lb 9.6 oz (110.9 kg)   SpO2 96%   BMI 40.70 kg/m    Physical Exam  Constitutional: She appears well-developed and well-nourished.  HENT:  Head: Normocephalic and atraumatic.  Right Ear: Hearing, external ear and ear canal normal. No drainage, swelling or tenderness. No foreign bodies. Tympanic membrane is erythematous. Tympanic membrane is not bulging. No middle ear effusion. No decreased hearing is noted.  Left Ear: Hearing, tympanic  membrane, external ear and ear canal normal. No drainage, swelling or tenderness. No foreign bodies. Tympanic membrane is not erythematous and not bulging.  No middle ear effusion. No decreased hearing is noted.  Nose: Nose normal. No rhinorrhea. Right sinus exhibits no maxillary sinus tenderness and no frontal sinus tenderness. Left sinus exhibits no maxillary sinus tenderness and no frontal sinus tenderness.  Mouth/Throat: Uvula is midline and mucous membranes are normal. Posterior oropharyngeal edema present. No  oropharyngeal exudate, posterior oropharyngeal erythema or tonsillar abscesses.  Tonsils 2/4 with scant white patches of exudate.  Eyes: Conjunctivae are normal.  Cardiovascular: Regular rhythm, normal heart sounds and normal pulses.   Pulmonary/Chest: Effort normal and breath sounds normal. She has no wheezes. She has no rhonchi. She has no rales.  Lymphadenopathy:       Head (right side): No submental, no submandibular, no tonsillar, no preauricular, no posterior auricular and no occipital adenopathy present.       Head (left side): No submental, no submandibular, no tonsillar, no preauricular, no posterior auricular and no occipital adenopathy present.    She has no cervical adenopathy.  Neurological: She is alert.  Skin: Skin is warm and dry.  Psychiatric: She has a normal mood and affect. Her speech is normal and behavior is normal. Thought content normal.  Vitals reviewed.      Assessment & Plan:  1. Cough No wheezing, SOB. No acute respiratory distress. Chest x-ray to evaluate for PNA.  - DG Chest 2 View - amoxicillin (AMOXIL) 500 MG tablet; Take two tablets ( total 1000 mg) PO q24h for 10 days  Dispense: 20 tablet; Refill: 0 - albuterol (PROVENTIL HFA) 108 (90 Base) MCG/ACT inhaler; Inhale 2 puffs into the lungs every 6 (six) hours as needed for wheezing or shortness of breath.  Dispense: 1 Inhaler; Refill: 1  2. Sore throat Based on duration of symptoms, patient, mother, and I jointly agreed to treat with amoxicillin. Due to patient's history of having recurrent pharyngitis, enlarged tonsils, we jointly agreed on referral to ear nose and throat for consult. Pending upper respiratory culture. Rapid strep negative. - Ambulatory referral to ENT - Upper Respiratory Culture, Routine - POCT Rapid Strep A - amoxicillin (AMOXIL) 500 MG tablet; Take two tablets ( total 1000 mg) PO q24h for 10 days  Dispense: 20 tablet; Refill: 0     I am having Ms. Wilbourne start on amoxicillin and  albuterol. I am also having her maintain her fluticasone, cyclobenzaprine, ALPRAZolam, lidocaine, and guaiFENesin-codeine.   Meds ordered this encounter  Medications  . lidocaine (XYLOCAINE) 2 % solution  . guaiFENesin-codeine (ROBITUSSIN AC) 100-10 MG/5ML syrup    Sig: Take 5 mLs by mouth 3 (three) times daily as needed for cough.  Marland Kitchen. amoxicillin (AMOXIL) 500 MG tablet    Sig: Take two tablets ( total 1000 mg) PO q24h for 10 days    Dispense:  20 tablet    Refill:  0    Order Specific Question:   Supervising Provider    Answer:   Duncan DullULLO, TERESA L [[id number]]  . albuterol (PROVENTIL HFA) 108 (90 Base) MCG/ACT inhaler    Sig: Inhale 2 puffs into the lungs every 6 (six) hours as needed for wheezing or shortness of breath.    Dispense:  1 Inhaler    Refill:  1    Order Specific Question:   Supervising Provider    Answer:   Sherlene ShamsULLO, TERESA L [[id number]]    Return precautions given.   Risks, benefits,  and alternatives of the medications and treatment plan prescribed today were discussed, and patient expressed understanding.   Education regarding symptom management and diagnosis given to patient on AVS.  Continue to follow with Carollee Leitz, NP for routine health maintenance.   Kimberly Newman and I agreed with plan.   Rennie Plowman, FNP

## 2016-01-19 NOTE — Progress Notes (Signed)
Pre visit review using our clinic review tool, if applicable. No additional management support is needed unless otherwise documented below in the visit note. 

## 2016-01-19 NOTE — Patient Instructions (Addendum)
Pleasure meeting you today. Please schedule physical exam including Pap in the next couple months.  Use albuterol every 6 hours for first 24 hours to get good medication into the lungs and loosen congestion; after, you may use as needed and eventually stop all together when cough resolves.  Increase intake of clear fluids. Congestion is best treated by hydration, when mucus is wetter, it is thinner, less sticky, and easier to expel from the body, either through coughing up drainage, or by blowing your nose.   Get plenty of rest.   Use saline nasal drops and blow your nose frequently. Run a humidifier at night and elevate the head of the bed. Vicks Vapor rub will help with congestion and cough. Steam showers and sinus massage for congestion.   Use Acetaminophen or Ibuprofen as needed for fever or pain. Avoid second hand smoke. Even the smallest exposure will worsen symptoms.   Over the counter medications you can try include Delsym for cough, a decongestant for congestion, and Mucinex or Robitussin as an expectorant. Be sure to just get the plain Mucinex or Robitussin that just has one medication (Guaifenesen). We don't recommend the combination products. Note, be sure to drink two glasses of water with each dose of Mucinex as the medication will not work well without adequate hydration.   You can also try a teaspoon of honey to see if this will help reduce cough. Throat lozenges can sometimes be beneficial as well.    This illness will typically last 7 - 10 days.   Please follow up with our clinic if you develop a fever greater than 101 F, symptoms worsen, or do not resolve in the next week.

## 2016-01-22 LAB — UPPER RESPIRATORY CULTURE, ROUTINE

## 2016-02-03 ENCOUNTER — Encounter: Payer: Self-pay | Admitting: *Deleted

## 2016-02-09 NOTE — Discharge Instructions (Signed)
T & A INSTRUCTION SHEET - [facility] °[facility] ° °CREIGHTON VAUGHT, MD °PAUL H. JUENGEL, MD  °P. [facility] BENNETT °CHAPMAN MCQUEEN, MD ° °[address] , [place] [address] TEL. [phone] °[address] [place]  [address] [phone] ° °INFORMATION SHEET FOR A TONSILLECTOMY AND ADENDOIDECTOMY ° °About Your Tonsils and Adenoids ° The tonsils and adenoids are normal body tissues that are part of our immune system.  They normally help to protect us against diseases that may enter our mouth and nose.  However, sometimes the tonsils and/or adenoids become too large and obstruct our breathing, especially at night. °  ° If either of these things happen it helps to remove the tonsils and adenoids in order to become healthier. The operation to remove the tonsils and adenoids is called a tonsillectomy and adenoidectomy. ° °The Location of Your Tonsils and Adenoids ° The tonsils are located in the back of the throat on both side and sit in a cradle of muscles. The adenoids are located in the roof of the mouth, behind the nose, and closely associated with the opening of the Eustachian tube to the ear. ° °Surgery on Tonsils and Adenoids ° A tonsillectomy and adenoidectomy is a short operation which takes about thirty minutes.  This includes being put to sleep and being awakened.  Tonsillectomies and adenoidectomies are performed at [facility] and may require observation period in the recovery room prior to going home. ° °Following the Operation for a Tonsillectomy ° A cautery machine is used to control bleeding.  Bleeding from a tonsillectomy and adenoidectomy is minimal and postoperatively the risk of bleeding is approximately four percent, although this rarely life threatening. ° ° ° °After your tonsillectomy and adenoidectomy post-op care at home: ° °1. Our patients are able to go home the same day.  You may be given prescriptions for pain  medications and antibiotics, if indicated. °2. It is extremely important to remember that fluid intake is of utmost importance after a tonsillectomy.  The amount that you drink must be maintained in the postoperative period.  A good indication of whether a child is getting enough fluid is whether his/her urine output is constant.  As long as children are urinating or wetting their diaper every 6 - 8 hours this is usually enough fluid intake.   °3. Although rare, this is a risk of some bleeding in the first ten days after surgery.  This is usually occurs between day five and nine postoperatively.  This risk of bleeding is approximately four percent.  If you or your child should have any bleeding you should remain calm and notify our office or go directly to the Emergency Room at [facility] where they will contact us. Our doctors are available seven days a week for notification.  We recommend sitting up quietly in a chair, place an ice pack on the front of the neck and spitting out the blood gently until we are able to contact you.  Adults should gargle gently with ice water and this may help stop the bleeding.  If the bleeding does not stop after a short time, i.e. 10 to 15 minutes, or seems to be increasing again, please contact us or go to the hospital.   °4. It is common for the pain to be worse at 5 - 7 days postoperatively.  This occurs because the “scab” is peeling off and the mucous membrane (skin of   the throat) is growing back where the tonsils were.   °5. It is common for a low-grade fever, less than 102, during the first week after a tonsillectomy and adenoidectomy.  It is usually due to not drinking enough liquids, and we suggest your use liquid Tylenol or the pain medicine with Tylenol prescribed in order to keep your temperature below 102.  Please follow the directions on the back of the bottle. °6. Do not take aspirin or any products that contain aspirin such as Bufferin, Anacin,  Ecotrin, aspirin gum, Goodies, BC headache powders, etc., after a T&A because it can promote bleeding.  Please check with our office before administering any other medication that may been prescribed by other doctors during the two week post-operative period. °7. If you happen to look in the mirror or into your child’s mouth you will see white/gray patches on the back of the throat.  This is what a scab looks like in the mouth and is normal after having a T&A.  It will disappear once the tonsil area heals completely. However, it may cause a noticeable odor, and this too will disappear with time.     °8. You or your child may experience ear pain after having a T&A.  This is called referred pain and comes from the throat, but it is felt in the ears.  Ear pain is quite common and expected.  It will usually go away after ten days.  There is usually nothing wrong with the ears, and it is primarily due to the healing area stimulating the nerve to the ear that runs along the side of the throat.  Use either the prescribed pain medicine or Tylenol as needed.  °9. The throat tissues after a tonsillectomy are obviously sensitive.  Smoking around children who have had a tonsillectomy significantly increases the risk of bleeding.  DO NOT SMOKE!  ° °General Anesthesia, Adult, Care After °Refer to this sheet in the next few weeks. These instructions provide you with information on caring for yourself after your procedure. Your health care provider may also give you more specific instructions. Your treatment has been planned according to current medical practices, but problems sometimes occur. Call your health care provider if you have any problems or questions after your procedure. °WHAT TO EXPECT AFTER THE PROCEDURE °After the procedure, it is typical to experience: °· Sleepiness. °· Nausea and vomiting. °HOME CARE INSTRUCTIONS °· For the first 24 hours after general anesthesia: °¨ Have a responsible person with you. °¨ Do not  drive a car. If you are alone, do not take public transportation. °¨ Do not drink alcohol. °¨ Do not take medicine that has not been prescribed by your health care provider. °¨ Do not sign important papers or make important decisions. °¨ You may resume a normal diet and activities as directed by your health care provider. °· Change bandages (dressings) as directed. °· If you have questions or problems that seem related to general anesthesia, call the hospital and ask for the anesthetist or anesthesiologist on call. °SEEK MEDICAL CARE IF: °· You have nausea and vomiting that continue the day after anesthesia. °· You develop a rash. °SEEK IMMEDIATE MEDICAL CARE IF:  °· You have difficulty breathing. °· You have chest pain. °· You have any allergic problems. °  °This information is not intended to replace advice given to you by your health care provider. Make sure you discuss any questions you have with your health care provider. °  °Document   Released: 07/25/2000 Document Revised: 05/09/2014 Document Reviewed: 08/17/2011 °Elsevier Interactive Patient Education ©2016 [employer] ° °

## 2016-02-10 ENCOUNTER — Ambulatory Visit: Payer: 59 | Admitting: Anesthesiology

## 2016-02-10 ENCOUNTER — Encounter
Admission: RE | Disposition: A | Discharge status: Home / Self Care | Payer: Self-pay | Source: Ambulatory Visit | Attending: Otolaryngology

## 2016-02-10 ENCOUNTER — Ambulatory Visit
Admission: RE | Admit: 2016-02-10 | Discharge: 2016-02-10 | Disposition: A | Discharge status: Home / Self Care | Payer: 59 | Source: Ambulatory Visit | Attending: Otolaryngology | Admitting: Otolaryngology

## 2016-02-10 DIAGNOSIS — Z6841 Body Mass Index (BMI) 40.0 and over, adult: Secondary | ICD-10-CM | POA: Insufficient documentation

## 2016-02-10 DIAGNOSIS — Z7951 Long term (current) use of inhaled steroids: Secondary | ICD-10-CM | POA: Diagnosis not present

## 2016-02-10 DIAGNOSIS — J3501 Chronic tonsillitis: Secondary | ICD-10-CM | POA: Insufficient documentation

## 2016-02-10 DIAGNOSIS — J029 Acute pharyngitis, unspecified: Secondary | ICD-10-CM | POA: Diagnosis present

## 2016-02-10 HISTORY — DX: Pain in unspecified shoulder: M25.519

## 2016-02-10 HISTORY — PX: TONSILLECTOMY: SHX5217

## 2016-02-10 HISTORY — DX: Presence of spectacles and contact lenses: Z97.3

## 2016-02-10 HISTORY — DX: Cervicalgia: M54.2

## 2016-02-10 SURGERY — TONSILLECTOMY
Anesthesia: General | Site: Throat | Wound class: Clean Contaminated

## 2016-02-10 MED ORDER — PROPOFOL 10 MG/ML IV BOLUS
INTRAVENOUS | Status: DC | PRN
  Administered 2016-02-10: 170 mg via INTRAVENOUS
  Administered 2016-02-10: 30 mg via INTRAVENOUS

## 2016-02-10 MED ORDER — FENTANYL CITRATE (PF) 100 MCG/2ML IJ SOLN
INTRAMUSCULAR | Status: DC | PRN
  Administered 2016-02-10: 100 ug via INTRAVENOUS
  Administered 2016-02-10: 25 ug via INTRAVENOUS

## 2016-02-10 MED ORDER — OXYCODONE HCL 5 MG PO TABS: 5.0000 mg | ORAL_TABLET | Freq: Once | ORAL | Status: AC | PRN

## 2016-02-10 MED ORDER — FENTANYL CITRATE (PF) 100 MCG/2ML IJ SOLN
25.0000 ug | INTRAMUSCULAR | Status: DC | PRN
  Administered 2016-02-10: 25 ug via INTRAVENOUS

## 2016-02-10 MED ORDER — DEXAMETHASONE SODIUM PHOSPHATE 4 MG/ML IJ SOLN
INTRAMUSCULAR | Status: DC | PRN
  Administered 2016-02-10: 8 mg via INTRAVENOUS

## 2016-02-10 MED ORDER — OXYMETAZOLINE HCL 0.05 % NA SOLN
NASAL | Status: DC | PRN
  Administered 2016-02-10: 1 via TOPICAL

## 2016-02-10 MED ORDER — ONDANSETRON HCL 4 MG/2ML IJ SOLN: 4.0000 mg | Freq: Once | INTRAMUSCULAR | Status: DC | PRN

## 2016-02-10 MED ORDER — SUCCINYLCHOLINE CHLORIDE 20 MG/ML IJ SOLN
INTRAMUSCULAR | Status: DC | PRN
  Administered 2016-02-10: 100 mg via INTRAVENOUS

## 2016-02-10 MED ORDER — ACETAMINOPHEN 10 MG/ML IV SOLN
1000.0000 mg | Freq: Once | INTRAVENOUS | Status: AC
  Administered 2016-02-10: 1000 mg via INTRAVENOUS

## 2016-02-10 MED ORDER — LIDOCAINE VISCOUS 2 % MT SOLN: 10.0000 mL | Freq: Four times a day (QID) | OROMUCOSAL | 0 refills | Status: DC | PRN

## 2016-02-10 MED ORDER — OXYCODONE HCL 5 MG/5ML PO SOLN: 10.0000 mg | ORAL | 0 refills | Status: DC | PRN

## 2016-02-10 MED ORDER — GLYCOPYRROLATE 0.2 MG/ML IJ SOLN
INTRAMUSCULAR | Status: DC | PRN
  Administered 2016-02-10: .1 mg via INTRAVENOUS

## 2016-02-10 MED ORDER — OXYCODONE HCL 5 MG/5ML PO SOLN
5.0000 mg | Freq: Once | ORAL | Status: AC | PRN
  Administered 2016-02-10: 5 mg via ORAL

## 2016-02-10 MED ORDER — LACTATED RINGERS IV SOLN
INTRAVENOUS | Status: DC
  Administered 2016-02-10: 08:00:00 via INTRAVENOUS

## 2016-02-10 MED ORDER — MIDAZOLAM HCL 5 MG/5ML IJ SOLN
INTRAMUSCULAR | Status: DC | PRN
  Administered 2016-02-10: 2 mg via INTRAVENOUS

## 2016-02-10 MED ORDER — SCOPOLAMINE 1 MG/3DAYS TD PT72
1.0000 | MEDICATED_PATCH | Freq: Once | TRANSDERMAL | Status: DC
  Administered 2016-02-10: 1.5 mg via TRANSDERMAL

## 2016-02-10 MED ORDER — PROMETHAZINE HCL 12.5 MG PO TABS: 12.5000 mg | ORAL_TABLET | Freq: Four times a day (QID) | ORAL | 0 refills | Status: DC | PRN

## 2016-02-10 MED ORDER — LIDOCAINE HCL (CARDIAC) 20 MG/ML IV SOLN
INTRAVENOUS | Status: DC | PRN
  Administered 2016-02-10: 40 mg via INTRAVENOUS

## 2016-02-10 MED ORDER — ONDANSETRON HCL 4 MG/2ML IJ SOLN
INTRAMUSCULAR | Status: DC | PRN
  Administered 2016-02-10: 4 mg via INTRAVENOUS

## 2016-02-10 SURGICAL SUPPLY — 17 items
BLADE BOVIE TIP EXT 4 (BLADE) ×4 IMPLANT
CANISTER SUCT 1200ML W/VALVE (MISCELLANEOUS) ×4 IMPLANT
CATH ROBINSON RED A/P 10FR (CATHETERS) ×4 IMPLANT
COAG SUCT 10F 3.5MM HAND CTRL (MISCELLANEOUS) ×4 IMPLANT
GLOVE BIO SURGEON STRL SZ7.5 (GLOVE) ×8 IMPLANT
HANDLE SUCTION POOLE (INSTRUMENTS) ×2 IMPLANT
KIT ROOM TURNOVER OR (KITS) ×4 IMPLANT
NEEDLE HYPO 25GX1X1/2 BEV (NEEDLE) ×4 IMPLANT
NS IRRIG 500ML POUR BTL (IV SOLUTION) ×4 IMPLANT
PACK TONSIL/ADENOIDS (PACKS) ×4 IMPLANT
PAD GROUND ADULT SPLIT (MISCELLANEOUS) ×4 IMPLANT
PENCIL ELECTRO HAND CTR (MISCELLANEOUS) ×4 IMPLANT
SOL ANTI-FOG 6CC FOG-OUT (MISCELLANEOUS) ×2 IMPLANT
SOL FOG-OUT ANTI-FOG 6CC (MISCELLANEOUS) ×2
STRAP BODY AND KNEE 60X3 (MISCELLANEOUS) ×4 IMPLANT
SUCTION POOLE HANDLE (INSTRUMENTS) ×4
SYR 5ML LL (SYRINGE) ×4 IMPLANT

## 2016-02-10 NOTE — Op Note (Signed)
..  02/10/2016  9:26 AM    Hamilton CapriMatthews, Mikeala  [id number]   Pre-Op Dx:  TONSILLITIS   Post-op Dx: TONSILLITIS   Proc:Tonsillectomy >age 25  Surg: Tjuana Vickrey  Anes:  General Endotracheal  EBL:  15cc  Comp:  None  Findings:  3+ erythematous and cryptic tonsils with moderate fibrotic scar to underlying musculature.  Significant vascularity of tonsils at superior and inferior pole.  No adenoids.  Procedure: After the patient was identified in holding and the history and physical and consent was reviewed, the patient was taken to the operating room and placed in a supine position.  General endotracheal anesthesia was induced in the normal fashion.  At this time, the patient was rotated 45 degrees and a shoulder roll was placed.  At this time, a McIvor mouthgag was inserted into the patient's oral cavity and suspended from the Mayo stand without injury to teeth, lips, or gums.  Next a red rubber catheter was inserted into the patient left nostril for retraction of the uvula and soft palate superiorly.  Next a curved Alice clamp was attached to the patient's right superior tonsillar pole and retracted medially and inferiorly.  A Bovie electrocautery was used to dissect the patient's right tonsil in a subcapsular plane.  Meticulous hemostasis was achieved with Bovie suction cautery.  At this time, the mouth gag was released from suspension for 1 minute.  Attention now was directed to the patient's left side.  In a similar fashion the curved Alice clamp was attached to the superior pole and this was retracted medially and inferiorly and the tonsil was excised in a subcapsular plane with Bovie electrocautery.  After completion of the second tonsil, meticulous hemostasis was continued.  At this time, attention was directed to the patient's Adenoidectomy.  Under indirect visualization using an operating mirror, the adenoid tissue was visualized and noted to be non-obstructive in nature so no  adenoidectomy was performed.    At this time, the patient's nasal cavity and oral cavity was irrigated with sterile saline.    Following this  The care of patient was returned to anesthesia, awakened, and transferred to recovery in stable condition.  Dispo:  PACU to home  Plan: Soft diet.  Limit exercise and strenuous activity for 2 weeks.  Fluid hydration  Recheck my office three weeks.   Zeb Rawl 9:26 AM 02/10/2016

## 2016-02-10 NOTE — Anesthesia Procedure Notes (Signed)
Procedure Name: Intubation Date/Time: 02/10/2016 8:58 AM Performed by: Jimmy PicketAMYOT, Yina Riviere Pre-anesthesia Checklist: Patient identified, Emergency Drugs available, Suction available, Patient being monitored and Timeout performed Patient Re-evaluated:Patient Re-evaluated prior to inductionOxygen Delivery Method: Circle system utilized Preoxygenation: Pre-oxygenation with 100% oxygen Intubation Type: IV induction Ventilation: Mask ventilation without difficulty Laryngoscope Size: Miller and 3 Grade View: Grade I Tube type: Oral Rae Tube size: 7.0 mm Number of attempts: 1 Placement Confirmation: ETT inserted through vocal cords under direct vision,  positive ETCO2 and breath sounds checked- equal and bilateral Tube secured with: Tape Dental Injury: Teeth and Oropharynx as per pre-operative assessment

## 2016-02-10 NOTE — H&P (Signed)
..  History and Physical paper copy reviewed and updated date of procedure and will be scanned into system.  

## 2016-02-10 NOTE — Transfer of Care (Signed)
Immediate Anesthesia Transfer of Care Note  Patient: Kimberly Newman  Procedure(s) Performed: Procedure(s): TONSILLECTOMY (N/A)  Patient Location: PACU  Anesthesia Type: General ETT  Level of Consciousness: awake, alert  and patient cooperative  Airway and Oxygen Therapy: Patient Spontanous Breathing and Patient connected to supplemental oxygen  Post-op Assessment: Post-op Vital signs reviewed, Patient's Cardiovascular Status Stable, Respiratory Function Stable, Patent Airway and No signs of Nausea or vomiting  Post-op Vital Signs: Reviewed and stable  Complications: No apparent anesthesia complications

## 2016-02-10 NOTE — Anesthesia Postprocedure Evaluation (Signed)
Anesthesia Post Note  Patient: Kimberly Newman  Procedure(s) Performed: Procedure(s) (LRB): TONSILLECTOMY (N/A)  Patient location during evaluation: PACU Anesthesia Type: General Level of consciousness: awake and alert and oriented Pain management: satisfactory to patient Vital Signs Assessment: post-procedure vital signs reviewed and stable Respiratory status: spontaneous breathing, nonlabored ventilation and respiratory function stable Cardiovascular status: blood pressure returned to baseline and stable Postop Assessment: Adequate PO intake and No signs of nausea or vomiting Anesthetic complications: no    Cherly BeachStella, Roberth Berling J

## 2016-02-10 NOTE — Anesthesia Preprocedure Evaluation (Signed)
Anesthesia Evaluation  Patient identified by MRN, date of birth, ID band Patient awake    Reviewed: Allergy & Precautions, H&P , NPO status , Patient's Chart, lab work & pertinent test results  Airway Mallampati: I  TM Distance: >3 FB Neck ROM: full    Dental no notable dental hx.    Pulmonary    Pulmonary exam normal        Cardiovascular Normal cardiovascular exam     Neuro/Psych    GI/Hepatic   Endo/Other  Morbid obesity  Renal/GU      Musculoskeletal   Abdominal   Peds  Hematology   Anesthesia Other Findings   Reproductive/Obstetrics                             Anesthesia Physical Anesthesia Plan  ASA: II  Anesthesia Plan: General ETT   Post-op Pain Management:    Induction:   Airway Management Planned:   Additional Equipment:   Intra-op Plan:   Post-operative Plan:   Informed Consent: I have reviewed the patients History and Physical, chart, labs and discussed the procedure including the risks, benefits and alternatives for the proposed anesthesia with the patient or authorized representative who has indicated his/her understanding and acceptance.     Plan Discussed with:   Anesthesia Plan Comments:         Anesthesia Quick Evaluation

## 2016-02-11 ENCOUNTER — Encounter: Payer: Self-pay | Admitting: Otolaryngology

## 2016-02-12 LAB — SURGICAL PATHOLOGY

## 2016-03-23 ENCOUNTER — Encounter: Payer: 59 | Admitting: Family

## 2016-07-04 ENCOUNTER — Ambulatory Visit: Payer: 59 | Admitting: Family

## 2016-07-04 ENCOUNTER — Ambulatory Visit (INDEPENDENT_AMBULATORY_CARE_PROVIDER_SITE_OTHER): Payer: 59 | Admitting: Family

## 2016-07-04 ENCOUNTER — Encounter: Payer: Self-pay | Admitting: Family

## 2016-07-04 ENCOUNTER — Other Ambulatory Visit (HOSPITAL_COMMUNITY)
Admission: RE | Admit: 2016-07-04 | Discharge: 2016-07-04 | Disposition: A | Discharge status: Home / Self Care | Payer: 59 | Source: Ambulatory Visit | Attending: Internal Medicine | Admitting: Internal Medicine

## 2016-07-04 VITALS — BP 108/80 | HR 88 | Temp 98.3°F | Ht 65.0 in | Wt 250.0 lb

## 2016-07-04 DIAGNOSIS — Z01419 Encounter for gynecological examination (general) (routine) without abnormal findings: Secondary | ICD-10-CM | POA: Diagnosis present

## 2016-07-04 DIAGNOSIS — Z0000 Encounter for general adult medical examination without abnormal findings: Secondary | ICD-10-CM

## 2016-07-04 NOTE — Progress Notes (Signed)
Subjective:    Patient ID: Kimberly AntonAmber E Vroom, female    DOB: 12/28/1990, 26 y.o.   MRN: [id number]  CC: Kimberly Antonmber E Durell is a 26 y.o. female who presents today for physical exam.    HPI: Feeling well.   No complaints today.   No vaginal complaints. No concern for STDs    Colorectal Cancer Screening: No early family history Breast Cancer Screening: no early family history Cervical Cancer Screening: Due Bone Health screening/DEXA for 65+: No increased fracture risk. Defer screening at this time. Lung Cancer Screening: Doesn't have 30 year pack year history and age > 55 years.       Tetanus - UTD        Pneumococcal - Not a Candidate for.  Labs: Screening labs today. Exercise: Gym regularly.  Alcohol use: occasional Smoking/tobacco use: Nonsmoker.  Regular dental exams: In need of dental exam. Wears seat belt: Yes. Skin: Follows [facility].   HISTORY:  Past Medical History:  Diagnosis Date  . Frequent headaches    Ibuprofen, 2-3x/wk  . History of chickenpox   . Neck and shoulder pain    sees chiropracter every other Fri  . Wears contact lenses    sometimes    Past Surgical History:  Procedure Laterality Date  . NO PAST SURGERIES    . TONSILLECTOMY N/A 02/10/2016   Procedure: TONSILLECTOMY;  Surgeon: Bud Facereighton Vaught, MD;  [facility];  Service: ENT;  Laterality: N/A;   Family History  Problem Relation Age of Onset  . Hyperlipidemia Mother   . Hypertension Mother   . Kidney disease Mother     Kidney stones  . Diabetes Mother   . Alcohol abuse Maternal Grandmother   . Hyperlipidemia Maternal Grandmother   . Hypertension Maternal Grandmother   . Diabetes Maternal Grandmother   . Leukemia Maternal Grandmother   . Hyperlipidemia Maternal Grandfather   . Hypertension Maternal Grandfather   . Diabetes Maternal Grandfather   . Parkinson's disease Maternal Grandfather   . Breast cancer Neg Hx       ALLERGIES: Patient has no  known allergies.  Current Outpatient Prescriptions on File Prior to Visit  Medication Sig Dispense Refill  . albuterol (PROVENTIL HFA) 108 (90 Base) MCG/ACT inhaler Inhale 2 puffs into the lungs every 6 (six) hours as needed for wheezing or shortness of breath. 1 Inhaler 1  . ALPRAZolam (XANAX) 0.5 MG tablet Take 1 tablet (0.5 mg total) by mouth at bedtime as needed for anxiety. 10 tablet 0  . fluticasone (FLONASE) 50 MCG/ACT nasal spray Place 2 sprays into both nostrils daily. 16 g 6  . lidocaine (XYLOCAINE) 2 % solution Use as directed 10 mLs in the mouth or throat every 6 (six) hours as needed for mouth pain (Swish and spit). 250 mL 0  . oxyCODONE (ROXICODONE) 5 MG/5ML solution Take 10 mLs (10 mg total) by mouth every 4 (four) hours as needed for severe pain. 400 mL 0  . promethazine (PHENERGAN) 12.5 MG tablet Take 1 tablet (12.5 mg total) by mouth every 6 (six) hours as needed for nausea or vomiting. 30 tablet 0   No current facility-administered medications on file prior to visit.     Social History  Substance Use Topics  . Smoking status: Never Smoker  . Smokeless tobacco: Never Used  . Alcohol use 0.0 oz/week     Comment: holidays    Review of Systems  Constitutional: Negative for chills, fever and unexpected weight change.  HENT: Negative for congestion.   Respiratory: Negative for cough.   Cardiovascular: Negative for chest pain, palpitations and leg swelling.  Gastrointestinal: Negative for nausea and vomiting.  Musculoskeletal: Negative for arthralgias and myalgias.  Skin: Negative for rash.  Neurological: Negative for headaches.  Hematological: Negative for adenopathy.  Psychiatric/Behavioral: Negative for confusion.      Objective:    BP 108/80 (BP Location: Left Arm, Patient Position: Sitting, Cuff Size: Large)   Pulse 88   Temp 98.3 F (36.8 C) (Oral)   Ht 5\' 5"  (1.651 m)   Wt 250 lb (113.4 kg)   SpO2 97%   BMI 41.60 kg/m   BP Readings from Last 3  Encounters:  07/04/16 108/80  02/10/16 119/84  01/19/16 (!) 108/98   Wt Readings from Last 3 Encounters:  07/04/16 250 lb (113.4 kg)  02/10/16 243 lb (110.2 kg)  01/19/16 244 lb 9.6 oz (110.9 kg)    Physical Exam  Constitutional: She appears well-developed and well-nourished.  Eyes: Conjunctivae are normal.  Neck: No thyroid mass and no thyromegaly present.  Cardiovascular: Normal rate, regular rhythm, normal heart sounds and normal pulses.   Pulmonary/Chest: Effort normal and breath sounds normal. She has no wheezes. She has no rhonchi. She has no rales. Right breast exhibits no inverted nipple, no mass, no nipple discharge, no skin change and no tenderness. Left breast exhibits no inverted nipple, no mass, no nipple discharge, no skin change and no tenderness. Breasts are symmetrical.  No masses or asymmetry appreciated during CBE.  Genitourinary: Uterus is not enlarged, not fixed and not tender. Cervix exhibits no motion tenderness, no discharge and no friability. Right adnexum displays no mass, no tenderness and no fullness. Left adnexum displays no mass, no tenderness and no fullness.  Genitourinary Comments: Pap performed. No CMT. Unable to appreciated ovaries.  Lymphadenopathy:       Head (right side): No submental, no submandibular, no tonsillar, no preauricular, no posterior auricular and no occipital adenopathy present.       Head (left side): No submental, no submandibular, no tonsillar, no preauricular, no posterior auricular and no occipital adenopathy present.       Right cervical: No superficial cervical, no deep cervical and no posterior cervical adenopathy present.      Left cervical: No superficial cervical, no deep cervical and no posterior cervical adenopathy present.    She has no axillary adenopathy.       Right axillary: No pectoral and no lateral adenopathy present.       Left axillary: No pectoral and no lateral adenopathy present. Neurological: She is alert.    Skin: Skin is warm and dry.  Psychiatric: She has a normal mood and affect. Her speech is normal and behavior is normal. Thought content normal.  Vitals reviewed.      Assessment & Plan:   Problem List Items Addressed This Visit      Other   Routine general medical examination at a health care facility - Primary    No early family history colon cancer, breast cancer. Pap performed today. No HPV included per guidelines at her age.Immunizations up-to-date. Screening labs ordered. Encouraged continued exercise. Follows with [facility]      Relevant Orders   Cytology - PAP   CBC with Differential/Platelet   Comprehensive metabolic panel   Hemoglobin A1c   Lipid panel   TSH   VITAMIN D 25 Hydroxy (Vit-D Deficiency, Fractures)       I am having Ms.  Ashley Royalty maintain her fluticasone, ALPRAZolam, albuterol, lidocaine, oxyCODONE, and promethazine.   No orders of the defined types were placed in this encounter.   Return precautions given.   Risks, benefits, and alternatives of the medications and treatment plan prescribed today were discussed, and patient expressed understanding.   Education regarding symptom management and diagnosis given to patient on AVS.   Continue to follow with Rennie Plowman, FNP for routine health maintenance.   Kimberly Newman and I agreed with plan.   Rennie Plowman, FNP

## 2016-07-04 NOTE — Assessment & Plan Note (Signed)
No early family history colon cancer, breast cancer. Pap performed today. No HPV included per guidelines at her age.Immunizations up-to-date. Screening labs ordered. Encouraged continued exercise. Follows with [facility]

## 2016-07-04 NOTE — Progress Notes (Signed)
Pre visit review using our clinic review tool, if applicable. No additional management support is needed unless otherwise documented below in the visit note. 

## 2016-07-04 NOTE — Patient Instructions (Signed)
Labs today  Health Maintenance, Female Adopting a healthy lifestyle and getting preventive care can go a long way to promote health and wellness. Talk with your health care provider about what schedule of regular examinations is right for you. This is a good chance for you to check in with your provider about disease prevention and staying healthy. In between checkups, there are plenty of things you can do on your own. Experts have done a lot of research about which lifestyle changes and preventive measures are most likely to keep you healthy. Ask your health care provider for more information. Weight and diet Eat a healthy diet  Be sure to include plenty of vegetables, fruits, low-fat dairy products, and lean protein.  Do not eat a lot of foods high in solid fats, added sugars, or salt.  Get regular exercise. This is one of the most important things you can do for your health.  Most adults should exercise for at least 150 minutes each week. The exercise should increase your heart rate and make you sweat (moderate-intensity exercise).  Most adults should also do strengthening exercises at least twice a week. This is in addition to the moderate-intensity exercise. Maintain a healthy weight  Body mass index (BMI) is a measurement that can be used to identify possible weight problems. It estimates body fat based on height and weight. Your health care provider can help determine your BMI and help you achieve or maintain a healthy weight.  For females 19 years of age and older:  A BMI below 18.5 is considered underweight.  A BMI of 18.5 to 24.9 is normal.  A BMI of 25 to 29.9 is considered overweight.  A BMI of 30 and above is considered obese. Watch levels of cholesterol and blood lipids  You should start having your blood tested for lipids and cholesterol at 26 years of age, then have this test every 5 years.  You may need to have your cholesterol levels checked more often if:  Your  lipid or cholesterol levels are high.  You are older than 26 years of age.  You are at high risk for heart disease. Cancer screening Lung Cancer  Lung cancer screening is recommended for adults 34-48 years old who are at high risk for lung cancer because of a history of smoking.  A yearly low-dose CT scan of the lungs is recommended for people who:  Currently smoke.  Have quit within the past 15 years.  Have at least a 30-pack-year history of smoking. A pack year is smoking an average of one pack of cigarettes a day for 1 year.  Yearly screening should continue until it has been 15 years since you quit.  Yearly screening should stop if you develop a health problem that would prevent you from having lung cancer treatment. Breast Cancer  Practice breast self-awareness. This means understanding how your breasts normally appear and feel.  It also means doing regular breast self-exams. Let your health care provider know about any changes, no matter how small.  If you are in your 20s or 30s, you should have a clinical breast exam (CBE) by a health care provider every 1-3 years as part of a regular health exam.  If you are 48 or older, have a CBE every year. Also consider having a breast X-ray (mammogram) every year.  If you have a family history of breast cancer, talk to your health care provider about genetic screening.  If you are at high risk for  breast cancer, talk to your health care provider about having an MRI and a mammogram every year.  Breast cancer gene (BRCA) assessment is recommended for women who have family members with BRCA-related cancers. BRCA-related cancers include:  Breast.  Ovarian.  Tubal.  Peritoneal cancers.  Results of the assessment will determine the need for genetic counseling and BRCA1 and BRCA2 testing. Cervical Cancer  Your health care provider may recommend that you be screened regularly for cancer of the pelvic organs (ovaries, uterus, and  vagina). This screening involves a pelvic examination, including checking for microscopic changes to the surface of your cervix (Pap test). You may be encouraged to have this screening done every 3 years, beginning at age 74.  For women ages 97-65, health care providers may recommend pelvic exams and Pap testing every 3 years, or they may recommend the Pap and pelvic exam, combined with testing for human papilloma virus (HPV), every 5 years. Some types of HPV increase your risk of cervical cancer. Testing for HPV may also be done on women of any age with unclear Pap test results.  Other health care providers may not recommend any screening for nonpregnant women who are considered low risk for pelvic cancer and who do not have symptoms. Ask your health care provider if a screening pelvic exam is right for you.  If you have had past treatment for cervical cancer or a condition that could lead to cancer, you need Pap tests and screening for cancer for at least 20 years after your treatment. If Pap tests have been discontinued, your risk factors (such as having a new sexual partner) need to be reassessed to determine if screening should resume. Some women have medical problems that increase the chance of getting cervical cancer. In these cases, your health care provider may recommend more frequent screening and Pap tests. Colorectal Cancer  This type of cancer can be detected and often prevented.  Routine colorectal cancer screening usually begins at 26 years of age and continues through 26 years of age.  Your health care provider may recommend screening at an earlier age if you have risk factors for colon cancer.  Your health care provider may also recommend using home test kits to check for hidden blood in the stool.  A small camera at the end of a tube can be used to examine your colon directly (sigmoidoscopy or colonoscopy). This is done to check for the earliest forms of colorectal  cancer.  Routine screening usually begins at age 61.  Direct examination of the colon should be repeated every 5-10 years through 27 years of age. However, you may need to be screened more often if early forms of precancerous polyps or small growths are found. Skin Cancer  Check your skin from head to toe regularly.  Tell your health care provider about any new moles or changes in moles, especially if there is a change in a mole's shape or color.  Also tell your health care provider if you have a mole that is larger than the size of a pencil eraser.  Always use sunscreen. Apply sunscreen liberally and repeatedly throughout the day.  Protect yourself by wearing long sleeves, pants, a wide-brimmed hat, and sunglasses whenever you are outside. Heart disease, diabetes, and high blood pressure  High blood pressure causes heart disease and increases the risk of stroke. High blood pressure is more likely to develop in:  People who have blood pressure in the high end of the normal range (130-139/85-89  mm Hg).  People who are overweight or obese.  People who are African American.  If you are 14-64 years of age, have your blood pressure checked every 3-5 years. If you are 69 years of age or older, have your blood pressure checked every year. You should have your blood pressure measured twice-once when you are at a hospital or clinic, and once when you are not at a hospital or clinic. Record the average of the two measurements. To check your blood pressure when you are not at a hospital or clinic, you can use:  An automated blood pressure machine at a pharmacy.  A home blood pressure monitor.  If you are between 92 years and 36 years old, ask your health care provider if you should take aspirin to prevent strokes.  Have regular diabetes screenings. This involves taking a blood sample to check your fasting blood sugar level.  If you are at a normal weight and have a low risk for diabetes,  have this test once every three years after 26 years of age.  If you are overweight and have a high risk for diabetes, consider being tested at a younger age or more often. Preventing infection Hepatitis B  If you have a higher risk for hepatitis B, you should be screened for this virus. You are considered at high risk for hepatitis B if:  You were born in a country where hepatitis B is common. Ask your health care provider which countries are considered high risk.  Your parents were born in a high-risk country, and you have not been immunized against hepatitis B (hepatitis B vaccine).  You have HIV or AIDS.  You use needles to inject street drugs.  You live with someone who has hepatitis B.  You have had sex with someone who has hepatitis B.  You get hemodialysis treatment.  You take certain medicines for conditions, including cancer, organ transplantation, and autoimmune conditions. Hepatitis C  Blood testing is recommended for:  Everyone born from [birth year] through 1965.  Anyone with known risk factors for hepatitis C. Sexually transmitted infections (STIs)  You should be screened for sexually transmitted infections (STIs) including gonorrhea and chlamydia if:  You are sexually active and are younger than 26 years of age.  You are older than 26 years of age and your health care provider tells you that you are at risk for this type of infection.  Your sexual activity has changed since you were last screened and you are at an increased risk for chlamydia or gonorrhea. Ask your health care provider if you are at risk.  If you do not have HIV, but are at risk, it may be recommended that you take a prescription medicine daily to prevent HIV infection. This is called pre-exposure prophylaxis (PrEP). You are considered at risk if:  You are sexually active and do not regularly use condoms or know the HIV status of your partner(s).  You take drugs by injection.  You are sexually  active with a partner who has HIV. Talk with your health care provider about whether you are at high risk of being infected with HIV. If you choose to begin PrEP, you should first be tested for HIV. You should then be tested every 3 months for as long as you are taking PrEP. Pregnancy  If you are premenopausal and you may become pregnant, ask your health care provider about preconception counseling.  If you may become pregnant, take 400 to 800 micrograms (mcg)  of folic acid every day.  If you want to prevent pregnancy, talk to your health care provider about birth control (contraception). Osteoporosis and menopause  Osteoporosis is a disease in which the bones lose minerals and strength with aging. This can result in serious bone fractures. Your risk for osteoporosis can be identified using a bone density scan.  If you are 44 years of age or older, or if you are at risk for osteoporosis and fractures, ask your health care provider if you should be screened.  Ask your health care provider whether you should take a calcium or vitamin D supplement to lower your risk for osteoporosis.  Menopause may have certain physical symptoms and risks.  Hormone replacement therapy may reduce some of these symptoms and risks. Talk to your health care provider about whether hormone replacement therapy is right for you. Follow these instructions at home:  Schedule regular health, dental, and eye exams.  Stay current with your immunizations.  Do not use any tobacco products including cigarettes, chewing tobacco, or electronic cigarettes.  If you are pregnant, do not drink alcohol.  If you are breastfeeding, limit how much and how often you drink alcohol.  Limit alcohol intake to no more than 1 drink per day for nonpregnant women. One drink equals 12 ounces of beer, 5 ounces of wine, or 1 ounces of hard liquor.  Do not use street drugs.  Do not share needles.  Ask your health care provider for  help if you need support or information about quitting drugs.  Tell your health care provider if you often feel depressed.  Tell your health care provider if you have ever been abused or do not feel safe at home. This information is not intended to replace advice given to you by your health care provider. Make sure you discuss any questions you have with your health care provider. Document Released: 11/01/2010 Document Revised: 09/24/2015 Document Reviewed: 01/20/2015 Elsevier Interactive Patient Education  2017 [employer].

## 2016-07-05 ENCOUNTER — Other Ambulatory Visit: Payer: Self-pay | Admitting: Family

## 2016-07-05 DIAGNOSIS — R945 Abnormal results of liver function studies: Principal | ICD-10-CM

## 2016-07-05 DIAGNOSIS — R7989 Other specified abnormal findings of blood chemistry: Secondary | ICD-10-CM

## 2016-07-05 LAB — CBC WITH DIFFERENTIAL/PLATELET
BASOS: 0 %
Basophils Absolute: 0 10*3/uL (ref 0.0–0.2)
EOS (ABSOLUTE): 0.3 10*3/uL (ref 0.0–0.4)
EOS: 3 %
HEMOGLOBIN: 13.6 g/dL (ref 11.1–15.9)
Hematocrit: 39.9 % (ref 34.0–46.6)
IMMATURE GRANULOCYTES: 0 %
Immature Grans (Abs): 0 10*3/uL (ref 0.0–0.1)
LYMPHS ABS: 3.4 10*3/uL — AB (ref 0.7–3.1)
Lymphs: 34 %
MCH: 29.1 pg (ref 26.6–33.0)
MCHC: 34.1 g/dL (ref 31.5–35.7)
MCV: 85 fL (ref 79–97)
MONOCYTES: 6 %
MONOS ABS: 0.6 10*3/uL (ref 0.1–0.9)
Neutrophils Absolute: 5.8 10*3/uL (ref 1.4–7.0)
Neutrophils: 57 %
PLATELETS: 371 10*3/uL (ref 150–379)
RBC: 4.67 x10E6/uL (ref 3.77–5.28)
RDW: 13.5 % (ref 12.3–15.4)
WBC: 10.1 10*3/uL (ref 3.4–10.8)

## 2016-07-05 LAB — HEMOGLOBIN A1C
ESTIMATED AVERAGE GLUCOSE: 108 mg/dL
HEMOGLOBIN A1C: 5.4 % (ref 4.8–5.6)

## 2016-07-05 LAB — LIPID PANEL
CHOLESTEROL TOTAL: 230 mg/dL — AB (ref 100–199)
Chol/HDL Ratio: 5.2 ratio units — ABNORMAL HIGH (ref 0.0–4.4)
HDL: 44 mg/dL (ref >39)
LDL Calculated: 163 mg/dL — ABNORMAL HIGH (ref 0–99)
TRIGLYCERIDES: 117 mg/dL (ref 0–149)
VLDL Cholesterol Cal: 23 mg/dL (ref 5–40)

## 2016-07-05 LAB — COMPREHENSIVE METABOLIC PANEL
ALK PHOS: 79 IU/L (ref 39–117)
ALT: 59 IU/L — AB (ref 0–32)
AST: 30 IU/L (ref 0–40)
Albumin/Globulin Ratio: 1.7 (ref 1.2–2.2)
Albumin: 4.2 g/dL (ref 3.5–5.5)
BILIRUBIN TOTAL: 0.3 mg/dL (ref 0.0–1.2)
BUN/Creatinine Ratio: 14 (ref 9–23)
BUN: 10 mg/dL (ref 6–20)
CHLORIDE: 101 mmol/L (ref 96–106)
CO2: 26 mmol/L (ref 18–29)
Calcium: 9.3 mg/dL (ref 8.7–10.2)
Creatinine, Ser: 0.74 mg/dL (ref 0.57–1.00)
GFR calc non Af Amer: 113 mL/min/{1.73_m2} (ref >59)
GFR, EST AFRICAN AMERICAN: 130 mL/min/{1.73_m2} (ref >59)
GLUCOSE: 100 mg/dL — AB (ref 65–99)
Globulin, Total: 2.5 g/dL (ref 1.5–4.5)
Potassium: 4.4 mmol/L (ref 3.5–5.2)
Sodium: 141 mmol/L (ref 134–144)
TOTAL PROTEIN: 6.7 g/dL (ref 6.0–8.5)

## 2016-07-05 LAB — VITAMIN D 25 HYDROXY (VIT D DEFICIENCY, FRACTURES): VIT D 25 HYDROXY: 8.6 ng/mL — AB (ref 30.0–100.0)

## 2016-07-05 LAB — TSH: TSH: 1.69 u[IU]/mL (ref 0.450–4.500)

## 2016-07-06 ENCOUNTER — Encounter: Payer: Self-pay | Admitting: Family

## 2016-07-06 LAB — CYTOLOGY - PAP: Diagnosis: NEGATIVE

## 2016-12-15 ENCOUNTER — Encounter: Payer: Self-pay | Admitting: Family

## 2016-12-15 ENCOUNTER — Ambulatory Visit (INDEPENDENT_AMBULATORY_CARE_PROVIDER_SITE_OTHER): Payer: 59 | Admitting: Family

## 2016-12-15 VITALS — BP 124/96 | HR 82 | Temp 99.1°F | Ht 65.0 in | Wt 256.8 lb

## 2016-12-15 DIAGNOSIS — E669 Obesity, unspecified: Secondary | ICD-10-CM

## 2016-12-15 DIAGNOSIS — F339 Major depressive disorder, recurrent, unspecified: Secondary | ICD-10-CM | POA: Diagnosis not present

## 2016-12-15 MED ORDER — DULOXETINE HCL 30 MG PO CPEP: ORAL_CAPSULE | ORAL | 3 refills | Status: DC

## 2016-12-15 NOTE — Assessment & Plan Note (Signed)
Discussed diet and exercise at length today. Have given patient hand out for low carbohydrate diet. Discussed Weight Watchers however she was not interested in at this time. Discussed with her that our focus would be on diet first and also sleep, once those things have improved, I would incorporate exercise program. We'll discuss this in 6 weeks at follow-up.

## 2016-12-15 NOTE — Patient Instructions (Signed)
Start cymbalta  Diet as discussed below. FOCUS on sleep, this is imperative for weight loss.   Follow up in 6 weeks   This is  Dr. Melina Schools  example of a  "Low GI"  Diet:  It will allow you to lose 4 to 8  lbs  per month if you follow it carefully.  Your goal with exercise is a minimum of 30 minutes of aerobic exercise 5 days per week (Walking does not count once it becomes easy!)    All of the foods can be found at grocery stores and in bulk at [employer].  The Atkins protein bars and shakes are available in more varieties at Target, WalMart and Lowe's Foods.     7 AM Breakfast:  Choose from the following:  Low carbohydrate Protein  Shakes (I recommend the  Premier Protein chocolate shakes,  EAS AdvantEdge "Carb Control" shakes  Or the Atkins shakes all are under 3 net carbs)     a scrambled egg/bacon/cheese burrito made with Mission's "carb balance" whole wheat tortilla  (about 10 net carbs )  Medical laboratory scientific officer (basically a quiche without the pastry crust) that is eaten cold and very convenient way to get your eggs.  8 carbs)  If you make your own protein shakes, avoid bananas and pineapple,  And use low carb greek yogurt or original /unsweetened almond or soy milk    Avoid cereal and bananas, oatmeal and cream of wheat and grits. They are loaded with carbohydrates!   10 AM: high protein snack:  Protein bar by Atkins (the snack size, under 200 cal, usually < 6 net carbs).    A stick of cheese:  Around 1 carb,  100 cal     Dannon Light n Fit [place] Yogurt  (80 cal, 8 carbs)  Other so called "protein bars" and Greek yogurts tend to be loaded with carbohydrates.  Remember, in food advertising, the word "energy" is synonymous for " carbohydrate."  Lunch:   A Sandwich using the bread choices listed, Can use any  Eggs,  lunchmeat, grilled meat or canned tuna), avocado, regular mayo/mustard  and cheese.  A Salad using blue cheese, ranch,  Goddess or vinagrette,  Avoid taco  shells, croutons or "confetti" and no "candied nuts" but regular nuts OK.   No pretzels, nabs  or chips.  Pickles and miniature sweet peppers are a good low carb alternative that provide a "crunch"  The bread is the only source of carbohydrate in a sandwich and  can be decreased by trying some of the attached alternatives to traditional loaf bread   Avoid "Low fat dressings, as well as Reyne Dumas and Smithfield Foods dressings They are loaded with sugar!   3 PM/ Mid day  Snack:  Consider  1 ounce of  almonds, walnuts, pistachios, pecans, peanuts,  Macadamia nuts or a nut medley.  Avoid "granola and granola bars "  Mixed nuts are ok in moderation as long as there are no raisins,  cranberries or dried fruit.   KIND bars are OK if you get the low glycemic index variety   Try the prosciutto/mozzarella cheese sticks by Fiorruci  In deli /backery section   High protein      6 PM  Dinner:     Meat/fowl/fish with a green salad, and either broccoli, cauliflower, green beans, spinach, brussel sprouts or  Lima beans. DO NOT BREAD THE PROTEIN!!      There is a low carb pasta  by Dreamfield's that is acceptable and tastes great: only 5 digestible carbs/serving.( All grocery stores but BJs carry it ) Several ready made meals are available low carb:   Try Michel Angelo's chicken piccata or chicken or eggplant parm over low carb pasta.(Lowes and BJs)   Clifton CustardAaron Sanchez's "Carnitas" (pulled pork, no sauce,  0 carbs) or his beef pot roast to make a dinner burrito (at BJ's)  Pesto over low carb pasta (bj's sells a good quality pesto in the center refrigerated section of the deli   Try satueeing  Roosvelt HarpsBok Choy with mushroooms as a good side   Green Giant makes a mashed cauliflower that tastes like mashed potatoes  Whole wheat pasta is still full of digestible carbs and  Not as low in glycemic index as Dreamfield's.   Brown rice is still rice,  So skip the rice and noodles if you eat [place] or [place] (or at least limit to  1/2 cup)  9 PM snack :   Breyer's "low carb" fudgsicle or  ice cream bar (Carb Smart line), or  Weight Watcher's ice cream bar , or another "no sugar added" ice cream;  a serving of fresh berries/cherries with whipped cream   Cheese or DANNON'S LlGHT N FIT GREEK YOGURT  8 ounces of Blue Diamond unsweetened almond/cococunut milk    Treat yourself to a parfait made with whipped cream blueberiies, walnuts and vanilla greek yogurt  Avoid bananas, pineapple, grapes  and watermelon on a regular basis because they are high in sugar.  THINK OF THEM AS DESSERT  Remember that snack Substitutions should be less than 10 NET carbs per serving and meals < 20 carbs. Remember to subtract fiber grams to get the "net carbs."  @TULLOBREADPACKAGE @

## 2016-12-15 NOTE — Assessment & Plan Note (Signed)
Worsening. Trial of Cymbalta. This will also help patient with her chronic joint pains and promote exercise.

## 2016-12-15 NOTE — Progress Notes (Signed)
Pre visit review using our clinic review tool, if applicable. No additional management support is needed unless otherwise documented below in the visit note. 

## 2016-12-15 NOTE — Progress Notes (Signed)
Subjective:    Patient ID: Kimberly Newman, female    DOB: 05/13/1990, 26 y.o.   MRN: [id number]  CC: Kimberly Newman is a 26 y.o. female who presents today for follow up.   HPI: Here to discuss elevated BMI and requirements at work. Would like a wellness form completed as well as alternative regimens suggested so she can complete this for work  Discussion about her weight gain and suspects that working shift 3 AM to 11 PM has influenced this. She soon will change to regular 9-5 position at work. Currently not exercising as she reports she has chronic joint pains "all over".   She also endorses depression of late due to stress at work, weight gain. No thoughts of hurting herself or anyone else. Notes, She only uses Xanax when she flies.  Currently sleeping about 3 hours a night. She skips breakfast and meals often          HISTORY:  Past Medical History:  Diagnosis Date  . Frequent headaches    Ibuprofen, 2-3x/wk  . History of chickenpox   . Neck and shoulder pain    sees chiropracter every other Fri  . Wears contact lenses    sometimes   Past Surgical History:  Procedure Laterality Date  . NO PAST SURGERIES    . TONSILLECTOMY N/A 02/10/2016   Procedure: TONSILLECTOMY;  Surgeon: Bud Face, MD;  Location: [facility];  Service: ENT;  Laterality: N/A;   Family History  Problem Relation Age of Onset  . Hyperlipidemia Mother   . Hypertension Mother   . Kidney disease Mother        Kidney stones  . Diabetes Mother   . Alcohol abuse Maternal Grandmother   . Hyperlipidemia Maternal Grandmother   . Hypertension Maternal Grandmother   . Diabetes Maternal Grandmother   . Leukemia Maternal Grandmother   . Hyperlipidemia Maternal Grandfather   . Hypertension Maternal Grandfather   . Diabetes Maternal Grandfather   . Parkinson's disease Maternal Grandfather   . Breast cancer Neg Hx     Allergies: Patient has no known allergies. Current Outpatient  Prescriptions on File Prior to Visit  Medication Sig Dispense Refill  . albuterol (PROVENTIL HFA) 108 (90 Base) MCG/ACT inhaler Inhale 2 puffs into the lungs every 6 (six) hours as needed for wheezing or shortness of breath. 1 Inhaler 1  . ALPRAZolam (XANAX) 0.5 MG tablet Take 1 tablet (0.5 mg total) by mouth at bedtime as needed for anxiety. 10 tablet 0  . fluticasone (FLONASE) 50 MCG/ACT nasal spray Place 2 sprays into both nostrils daily. 16 g 6   No current facility-administered medications on file prior to visit.     Social History  Substance Use Topics  . Smoking status: Never Smoker  . Smokeless tobacco: Never Used  . Alcohol use 0.0 oz/week     Comment: holidays    Review of Systems  Constitutional: Negative for chills and fever.  Respiratory: Negative for cough.   Cardiovascular: Negative for chest pain and palpitations.  Gastrointestinal: Negative for nausea and vomiting.  Musculoskeletal: Negative for joint swelling.  Psychiatric/Behavioral: Positive for sleep disturbance. Negative for suicidal ideas.      Objective:    BP (!) 124/96   Pulse 82   Temp 99.1 F (37.3 C) (Oral)   Ht 5\' 5"  (1.651 m)   Wt 256 lb 12.8 oz (116.5 kg)   SpO2 94%   BMI 42.73 kg/m  BP Readings from Last 3  Encounters:  12/15/16 (!) 124/96  07/04/16 108/80  02/10/16 119/84   Wt Readings from Last 3 Encounters:  12/15/16 256 lb 12.8 oz (116.5 kg)  07/04/16 250 lb (113.4 kg)  02/10/16 243 lb (110.2 kg)    Physical Exam  Constitutional: She appears well-developed and well-nourished.  Eyes: Conjunctivae are normal.  Cardiovascular: Normal rate, regular rhythm, normal heart sounds and normal pulses.   Pulmonary/Chest: Effort normal and breath sounds normal. She has no wheezes. She has no rhonchi. She has no rales.  Neurological: She is alert.  Skin: Skin is warm and dry.  Psychiatric: She has a normal mood and affect. Her speech is normal and behavior is normal. Thought content normal.   Vitals reviewed.      Assessment & Plan:   Problem List Items Addressed This Visit      Other   Obesity (BMI 30-39.9)    Discussed diet and exercise at length today. Have given patient hand out for low carbohydrate diet. Discussed Weight Watchers however she was not interested in at this time. Discussed with her that our focus would be on diet first and also sleep, once those things have improved, I would incorporate exercise program. We'll discuss this in 6 weeks at follow-up.      Depression, recurrent (HCC) - Primary    Worsening. Trial of Cymbalta. This will also help patient with her chronic joint pains and promote exercise.       Relevant Medications   DULoxetine (CYMBALTA) 30 MG capsule       I have discontinued Ms. Robles's lidocaine, oxyCODONE, and promethazine. I am also having her start on DULoxetine. Additionally, I am having her maintain her fluticasone, ALPRAZolam, and albuterol.   Meds ordered this encounter  Medications  . DULoxetine (CYMBALTA) 30 MG capsule    Sig: Take one 30 mg tablet by mouth once a day for the first week. Then increase to two 30 mg tablets ( total 60mg ) by mouth once daily.    Dispense:  60 capsule    Refill:  3    Order Specific Question:   Supervising Provider    Answer:   Sherlene ShamsULLO, TERESA L [[id number]]    Return precautions given.   Risks, benefits, and alternatives of the medications and treatment plan prescribed today were discussed, and patient expressed understanding.   Education regarding symptom management and diagnosis given to patient on AVS.  Continue to follow with Allegra GranaArnett, Margaret G, FNP for routine health maintenance.   Kimberly AntonAmber E Schoenberg and I agreed with plan.   Rennie PlowmanMargaret Arnett, FNP

## 2017-01-30 ENCOUNTER — Ambulatory Visit (INDEPENDENT_AMBULATORY_CARE_PROVIDER_SITE_OTHER): Payer: 59 | Admitting: Family

## 2017-01-30 ENCOUNTER — Encounter: Payer: Self-pay | Admitting: Family

## 2017-01-30 DIAGNOSIS — F339 Major depressive disorder, recurrent, unspecified: Secondary | ICD-10-CM

## 2017-01-30 MED ORDER — DULOXETINE HCL 60 MG PO CPEP: 60.0000 mg | ORAL_CAPSULE | Freq: Every day | ORAL | 1 refills | Status: DC

## 2017-01-30 NOTE — Progress Notes (Signed)
Subjective:    Patient ID: Kimberly Newman, female    DOB: 02/05/1991, 26 y.o.   MRN: [id number]  CC: Kimberly Newman is a 26 y.o. female who presents today for follow up.   HPI: Joint pain is getting better and plans to start exercising. Focusing on diet and had lost 3 pounds.  Likes Cymbalta and feeling better. Depression improved.   No thoughts of hurting herself or anyone else        HISTORY:  Past Medical History:  Diagnosis Date  . Frequent headaches    Ibuprofen, 2-3x/wk  . History of chickenpox   . Neck and shoulder pain    sees chiropracter every other Fri  . Wears contact lenses    sometimes   Past Surgical History:  Procedure Laterality Date  . NO PAST SURGERIES    . TONSILLECTOMY N/A 02/10/2016   Procedure: TONSILLECTOMY;  Surgeon: Bud Face, MD;  Location: [facility];  Service: ENT;  Laterality: N/A;   Family History  Problem Relation Age of Onset  . Hyperlipidemia Mother   . Hypertension Mother   . Kidney disease Mother        Kidney stones  . Diabetes Mother   . Alcohol abuse Maternal Grandmother   . Hyperlipidemia Maternal Grandmother   . Hypertension Maternal Grandmother   . Diabetes Maternal Grandmother   . Leukemia Maternal Grandmother   . Hyperlipidemia Maternal Grandfather   . Hypertension Maternal Grandfather   . Diabetes Maternal Grandfather   . Parkinson's disease Maternal Grandfather   . Breast cancer Neg Hx     Allergies: Patient has no known allergies. Current Outpatient Prescriptions on File Prior to Visit  Medication Sig Dispense Refill  . albuterol (PROVENTIL HFA) 108 (90 Base) MCG/ACT inhaler Inhale 2 puffs into the lungs every 6 (six) hours as needed for wheezing or shortness of breath. 1 Inhaler 1  . ALPRAZolam (XANAX) 0.5 MG tablet Take 1 tablet (0.5 mg total) by mouth at bedtime as needed for anxiety. 10 tablet 0  . fluticasone (FLONASE) 50 MCG/ACT nasal spray Place 2 sprays into both nostrils daily. 16 g  6   No current facility-administered medications on file prior to visit.     Social History  Substance Use Topics  . Smoking status: Never Smoker  . Smokeless tobacco: Never Used  . Alcohol use 0.0 oz/week     Comment: holidays    Review of Systems  Constitutional: Negative for chills and fever.  Respiratory: Negative for cough.   Cardiovascular: Negative for chest pain and palpitations.  Gastrointestinal: Negative for nausea and vomiting.  Psychiatric/Behavioral: Negative for suicidal ideas.      Objective:    BP 110/82   Pulse 84   Temp 98.5 F (36.9 C) (Oral)   Ht  (1.651 m)   Wt 255 lb (115.7 kg)   SpO2 93%   BMI 42.43 kg/m  BP Readings from Last 3 Encounters:  01/30/17 110/82  12/15/16 (!) 124/96  07/04/16 108/80   Wt Readings from Last 3 Encounters:  01/30/17 255 lb (115.7 kg)  12/15/16 256 lb 12.8 oz (116.5 kg)  07/04/16 250 lb (113.4 kg)    Physical Exam  Constitutional: She appears well-developed and well-nourished.  Eyes: Conjunctivae are normal.  Cardiovascular: Normal rate, regular rhythm, normal heart sounds and normal pulses.   Pulmonary/Chest: Effort normal and breath sounds normal. She has no wheezes. She has no rhonchi. She has no rales.  Neurological: She is alert.  Skin: Skin is warm and dry.  Psychiatric: She has a normal mood and affect. Her speech is normal and behavior is normal. Thought content normal.  Vitals reviewed.      Assessment & Plan:   Problem List Items Addressed This Visit      Other   Depression, recurrent (HCC)    Pleased to see improved on Cymbalta.Continue to encourage patient to increase exercise. Will follow.      Relevant Medications   DULoxetine (CYMBALTA) 60 MG capsule       I have changed Ms. Huish's DULoxetine. I am also having her maintain her fluticasone, ALPRAZolam, and albuterol.   Meds ordered this encounter  Medications  . DULoxetine (CYMBALTA) 60 MG capsule    Sig: Take 1 capsule  (60 mg total) by mouth daily.    Dispense:  90 capsule    Refill:  1    Order Specific Question:   Supervising Provider    Answer:   Sherlene Shams [[id number]]    Return precautions given.   Risks, benefits, and alternatives of the medications and treatment plan prescribed today were discussed, and patient expressed understanding.   Education regarding symptom management and diagnosis given to patient on AVS.  Continue to follow with Allegra Grana, FNP for routine health maintenance.   Kimberly Newman and I agreed with plan.   Rennie Plowman, FNP

## 2017-01-30 NOTE — Progress Notes (Signed)
Pre visit review using our clinic review tool, if applicable. No additional management support is needed unless otherwise documented below in the visit note. 

## 2017-01-30 NOTE — Patient Instructions (Addendum)
Pleasure seeing you and glad the Cymbalta is helping!  This is  Dr. Melina Schools  example of a  "Low GI"  Diet:  It will allow you to lose 4 to 8  lbs  per month if you follow it carefully.  Your goal with exercise is a minimum of 30 minutes of aerobic exercise 5 days per week (Walking does not count once it becomes easy!)    All of the foods can be found at grocery stores and in bulk at [employer].  The Atkins protein bars and shakes are available in more varieties at Target, WalMart and Lowe's Foods.     7 AM Breakfast:  Choose from the following:  Low carbohydrate Protein  Shakes (I recommend the  Premier Protein chocolate shakes,  EAS AdvantEdge "Carb Control" shakes  Or the Atkins shakes all are under 3 net carbs)     a scrambled egg/bacon/cheese burrito made with Mission's "carb balance" whole wheat tortilla  (about 10 net carbs )  Medical laboratory scientific officer (basically a quiche without the pastry crust) that is eaten cold and very convenient way to get your eggs.  8 carbs)  If you make your own protein shakes, avoid bananas and pineapple,  And use low carb greek yogurt or original /unsweetened almond or soy milk    Avoid cereal and bananas, oatmeal and cream of wheat and grits. They are loaded with carbohydrates!   10 AM: high protein snack:  Protein bar by Atkins (the snack size, under 200 cal, usually < 6 net carbs).    A stick of cheese:  Around 1 carb,  100 cal     Dannon Light n Fit [place] Yogurt  (80 cal, 8 carbs)  Other so called "protein bars" and Greek yogurts tend to be loaded with carbohydrates.  Remember, in food advertising, the word "energy" is synonymous for " carbohydrate."  Lunch:   A Sandwich using the bread choices listed, Can use any  Eggs,  lunchmeat, grilled meat or canned tuna), avocado, regular mayo/mustard  and cheese.  A Salad using blue cheese, ranch,  Goddess or vinagrette,  Avoid taco shells, croutons or "confetti" and no "candied nuts" but  regular nuts OK.   No pretzels, nabs  or chips.  Pickles and miniature sweet peppers are a good low carb alternative that provide a "crunch"  The bread is the only source of carbohydrate in a sandwich and  can be decreased by trying some of the attached alternatives to traditional loaf bread   Avoid "Low fat dressings, as well as Reyne Dumas and Smithfield Foods dressings They are loaded with sugar!   3 PM/ Mid day  Snack:  Consider  1 ounce of  almonds, walnuts, pistachios, pecans, peanuts,  Macadamia nuts or a nut medley.  Avoid "granola and granola bars "  Mixed nuts are ok in moderation as long as there are no raisins,  cranberries or dried fruit.   KIND bars are OK if you get the low glycemic index variety   Try the prosciutto/mozzarella cheese sticks by Fiorruci  In deli /backery section   High protein      6 PM  Dinner:     Meat/fowl/fish with a green salad, and either broccoli, cauliflower, green beans, spinach, brussel sprouts or  Lima beans. DO NOT BREAD THE PROTEIN!!      There is a low carb pasta by Dreamfield's that is acceptable and tastes great: only 5 digestible carbs/serving.( All grocery  stores but BJs carry it ) Several ready made meals are available low carb:   Try Michel Angelo's chicken piccata or chicken or eggplant parm over low carb pasta.(Lowes and BJs)   Clifton Custard Sanchez's "Carnitas" (pulled pork, no sauce,  0 carbs) or his beef pot roast to make a dinner burrito (at [employer]J's)  Pesto over low carb pasta (bj's sells a good quality pesto in the center refrigerated section of the deli   Try satueeing  Roosvelt Harps with mushroooms as a good side   Green Giant makes a mashed cauliflower that tastes like mashed potatoes  Whole wheat pasta is still full of digestible carbs and  Not as low in glycemic index as Dreamfield's.   Brown rice is still rice,  So skip the rice and noodles if you eat [place] or [place] (or at least limit to 1/2 cup)  9 PM snack :   Breyer's "low carb" fudgsicle  or  ice cream bar (Carb Smart line), or  Weight Watcher's ice cream bar , or another "no sugar added" ice cream;  a serving of fresh berries/cherries with whipped cream   Cheese or DANNON'S LlGHT N FIT GREEK YOGURT  8 ounces of Blue Diamond unsweetened almond/cococunut milk    Treat yourself to a parfait made with whipped cream blueberiies, walnuts and vanilla greek yogurt  Avoid bananas, pineapple, grapes  and watermelon on a regular basis because they are high in sugar.  THINK OF THEM AS DESSERT  Remember that snack Substitutions should be less than 10 NET carbs per serving and meals < 20 carbs. Remember to subtract fiber grams to get the "net carbs."  @

## 2017-01-30 NOTE — Assessment & Plan Note (Signed)
Pleased to see improved on Cymbalta.Continue to encourage patient to increase exercise. Will follow.

## 2017-03-25 ENCOUNTER — Other Ambulatory Visit: Payer: Self-pay | Admitting: Family

## 2017-03-25 DIAGNOSIS — F339 Major depressive disorder, recurrent, unspecified: Secondary | ICD-10-CM

## 2017-03-27 ENCOUNTER — Telehealth: Payer: Self-pay | Admitting: Family

## 2017-03-27 ENCOUNTER — Other Ambulatory Visit: Payer: Self-pay | Admitting: *Deleted

## 2017-03-27 DIAGNOSIS — F339 Major depressive disorder, recurrent, unspecified: Secondary | ICD-10-CM

## 2017-03-27 MED ORDER — DULOXETINE HCL 60 MG PO CPEP: 60.0000 mg | ORAL_CAPSULE | Freq: Every day | ORAL | 1 refills | Status: DC

## 2017-03-27 NOTE — Telephone Encounter (Signed)
Copied from CRM [id number]. Topic: Inquiry >> Mar 27, 2017  1:36 PM Kimberly Newman, Sherise Geerdes, [place] wrote: Reason for CRM: Patient called for her refill on her Cymbalta this morning. Patient has been in touch with her pharmacy and was told that her refill was denied and she would like to know why. If someone from the office could give her a call back about this matter.

## 2017-03-27 NOTE — Telephone Encounter (Signed)
Left message for pt to contact pharmacy for refills. Current prescription showing 90tabs dispensed with 1 refill available on 01/30/17.

## 2017-03-27 NOTE — Telephone Encounter (Signed)
Copied from CRM #[id number]. Topic: Quick Communication - See Telephone Encounter >> Mar 27, 2017  9:58 AM Guinevere FerrariMorris, Emya Picado E, NT wrote: CRM for notification. See Telephone encounter for: Pt is calling to see if she can get a refill on  DULoxetine (CYMBALTA) 60 MG capsule. Pt. [facility] [place] [facility] [address]. 03/27/17.

## 2017-03-27 NOTE — Progress Notes (Signed)
Refilled Cymbalta 

## 2017-08-01 ENCOUNTER — Telehealth: Payer: Self-pay | Admitting: Family

## 2017-08-01 DIAGNOSIS — F339 Major depressive disorder, recurrent, unspecified: Secondary | ICD-10-CM

## 2017-08-01 NOTE — Telephone Encounter (Signed)
Copied from CRM [id number]. Topic: Quick Communication - Rx Refill/Question >> Aug 01, 2017  2:13 PM Maia Petties wrote: Medication: DULoxetine (CYMBALTA) 60 MG capsule - pt states insurance is requiring her to get meds at [facility] and in 90 day supply - pt has 3 pills left (3 days) Has the patient contacted their pharmacy? Yes - need new RX Preferred Pharmacy (with phone number or street name): [id number] - [place], [place] - [address] AT [facility] [address] [place] [address] Phone: [phone] Fax: [phone]

## 2017-08-01 NOTE — Telephone Encounter (Signed)
Please advise 

## 2017-08-01 NOTE — Telephone Encounter (Signed)
Last office visit 07/17/16 appt sched 08/25/17 [facility]

## 2017-08-02 ENCOUNTER — Encounter: Payer: Self-pay | Admitting: Family

## 2017-08-02 MED ORDER — DULOXETINE HCL 60 MG PO CPEP: 60.0000 mg | ORAL_CAPSULE | Freq: Every day | ORAL | 0 refills | Status: DC

## 2017-08-02 NOTE — Telephone Encounter (Signed)
Please refill cymbalta for 90 days supply thanks

## 2017-08-02 NOTE — Telephone Encounter (Addendum)
Script sent, patient advised.  

## 2017-08-25 ENCOUNTER — Ambulatory Visit: Payer: 59 | Admitting: Family

## 2017-09-22 IMAGING — DX DG CHEST 2V
2 series · 2 of 2 positions shown · non-contrast
Comparison: None.

CLINICAL DATA: Cough and wheezing

EXAM:
CHEST  2 VIEW

[chest pa]
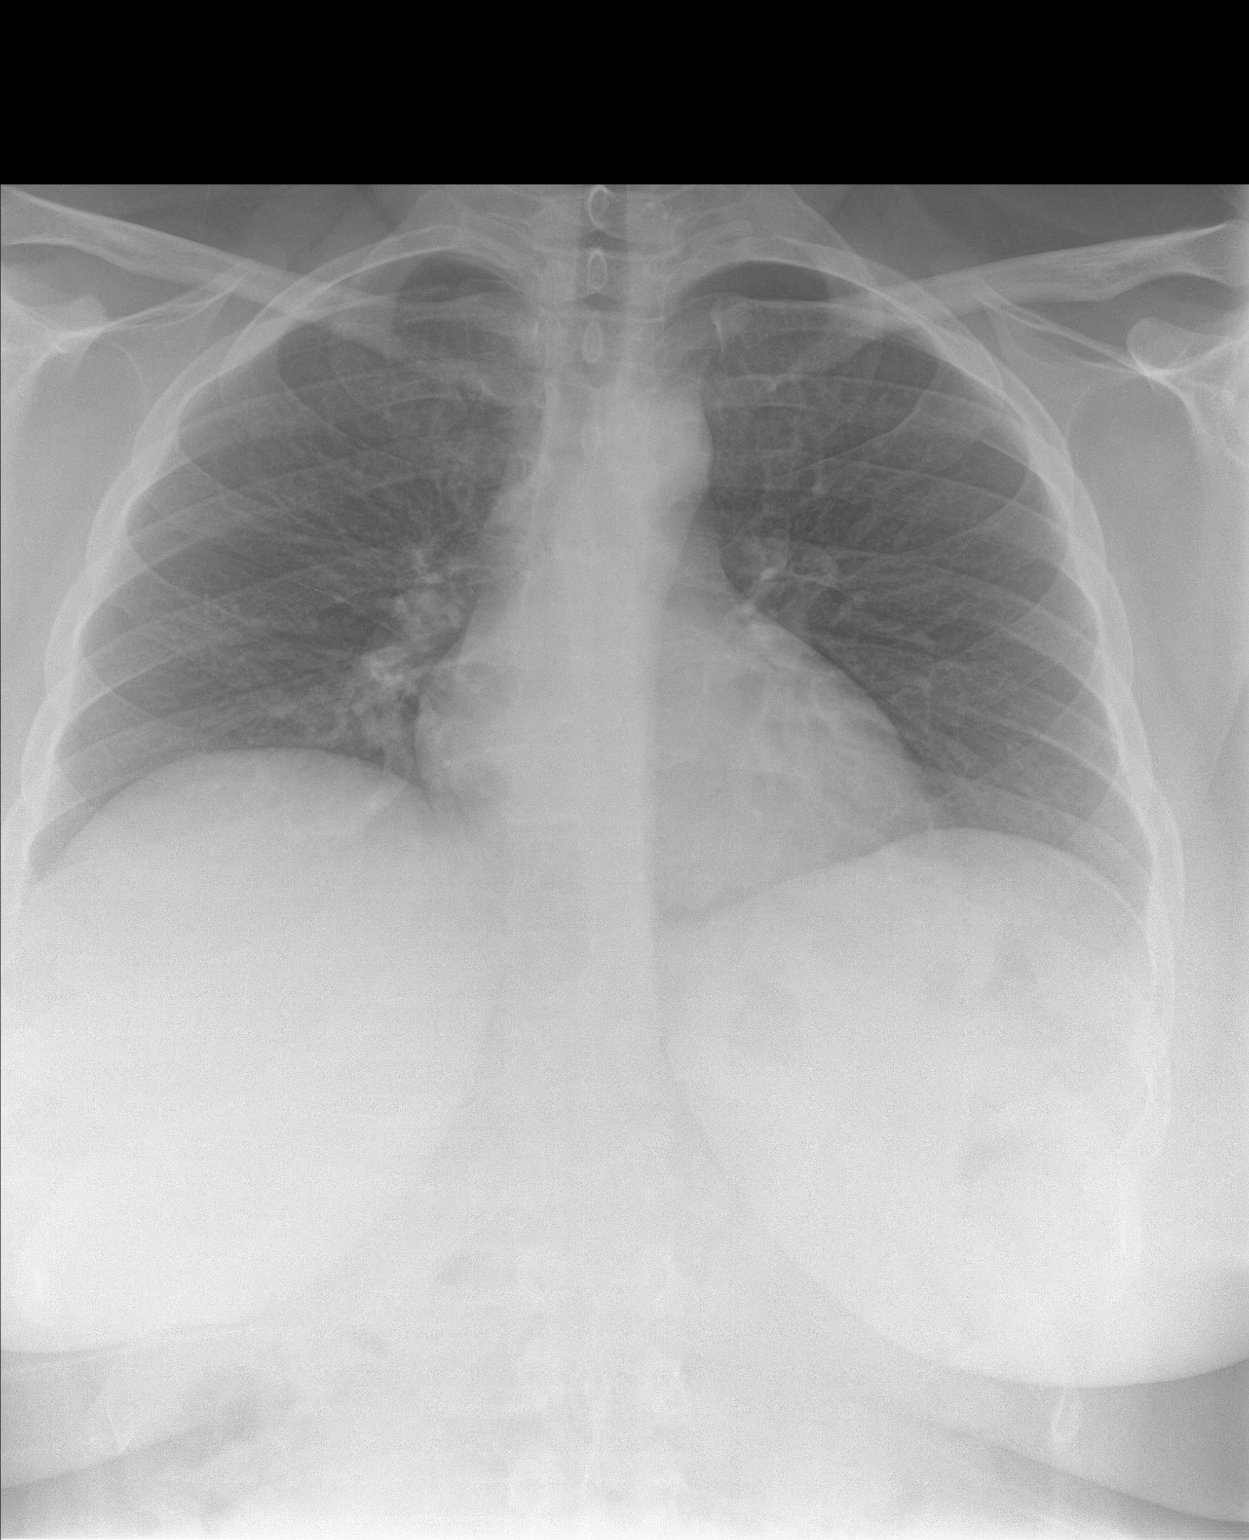

[chest lat]
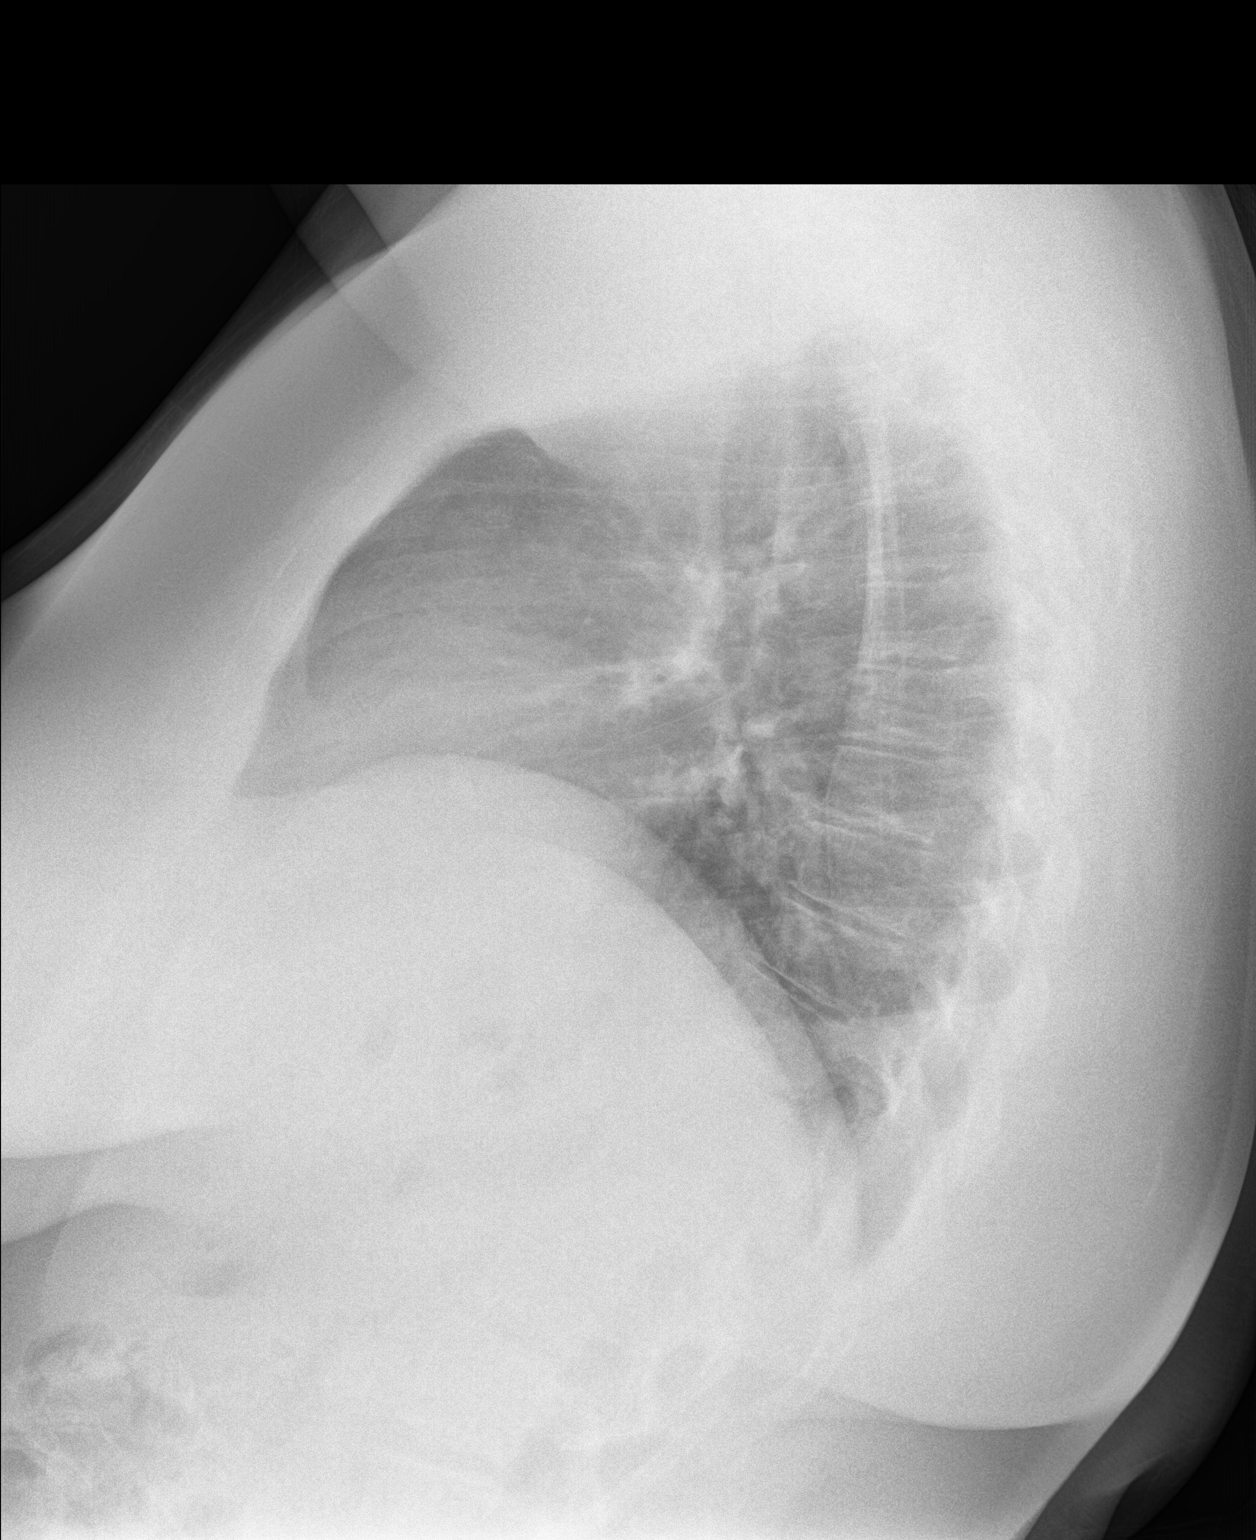

[2 of 2 positions shown; findings below may reference images not displayed]

FINDINGS: Lungs are clear. Heart size and pulmonary vascularity are normal. No
adenopathy. No bone lesions.
IMPRESSION: No edema or consolidation.

## 2017-11-18 ENCOUNTER — Other Ambulatory Visit: Payer: Self-pay | Admitting: Family

## 2017-11-18 DIAGNOSIS — F339 Major depressive disorder, recurrent, unspecified: Secondary | ICD-10-CM

## 2017-11-20 ENCOUNTER — Encounter: Payer: Self-pay | Admitting: Family

## 2017-11-20 NOTE — Telephone Encounter (Signed)
Last office visit 12/15/16 To follow up 6 weeks

## 2017-12-09 ENCOUNTER — Encounter: Payer: Self-pay | Admitting: Family

## 2017-12-11 ENCOUNTER — Encounter: Payer: Self-pay | Admitting: Family

## 2017-12-11 NOTE — Telephone Encounter (Signed)
When you reply back to patient can you find out the name of her pharmacy so I can update it in the system ? Thanks

## 2017-12-15 ENCOUNTER — Other Ambulatory Visit: Payer: Self-pay | Admitting: Family

## 2017-12-15 ENCOUNTER — Encounter: Payer: Self-pay | Admitting: Family

## 2017-12-15 DIAGNOSIS — F339 Major depressive disorder, recurrent, unspecified: Secondary | ICD-10-CM

## 2017-12-18 ENCOUNTER — Encounter: Payer: Self-pay | Admitting: Family

## 2017-12-19 NOTE — Telephone Encounter (Signed)
Patient is requesting Cymbalta 30 mg instead of Cymbalta 60 mg which she has been breaking in 1/2 . It doesn't look like script was sent. Ok to send in script ? See mychart message 12/09/17

## 2017-12-19 NOTE — Telephone Encounter (Signed)
Yes it is OK to send the 30mg .  LG

## 2017-12-20 MED ORDER — DULOXETINE HCL 30 MG PO CPEP: 30.0000 mg | ORAL_CAPSULE | Freq: Every day | ORAL | 0 refills | Status: AC

## 2017-12-21 NOTE — Telephone Encounter (Signed)
Ok so this message is confusing patient wants to titrate back up to 60 mg and insurance wouldn't pay for due to early fill because she just got the 60 mg . How can she taper up with 30 mg.  Please advise.

## 2017-12-21 NOTE — Telephone Encounter (Signed)
Patient can titrate up to 60mg  easily with the 30mg  tablets.  I advise to take 1 of the 30mg  tablet daily for 2 weeks, then take 2 of the 30mg  tablet to make 60mg  dose to use up her supply of the 30mg  tablets.   LG

## 2018-02-12 ENCOUNTER — Ambulatory Visit: Payer: Self-pay | Admitting: Family

## 2018-02-16 ENCOUNTER — Telehealth: Payer: Self-pay | Admitting: Family

## 2018-02-16 ENCOUNTER — Ambulatory Visit: Payer: 59 | Admitting: Family

## 2018-02-16 NOTE — Telephone Encounter (Signed)
Pt dropped off E [employer] form to be filled out  Placed in  Arnett's color folder upfront  Pt needs by 02/20/18

## 2018-02-19 NOTE — Telephone Encounter (Signed)
Placed in your folder for completion  

## 2018-02-19 NOTE — Telephone Encounter (Signed)
Left voice mail for patient to call back ok for [facility] to speak to patient   Left voicemail for patient to call . Form completed placed up front for pick up.
